# Patient Record
Sex: Male | Born: 1940 | Race: White | Hispanic: No | Marital: Married | Smoking: Light tobacco smoker
Health system: Southern US, Community
[De-identification: ages and names within clinical notes are randomized; demographics above are authoritative.]

## PROBLEM LIST (undated history)

## (undated) DIAGNOSIS — S060X9A Concussion with loss of consciousness of unspecified duration, initial encounter: Secondary | ICD-10-CM

## (undated) DIAGNOSIS — S060X1A Concussion with loss of consciousness of 30 minutes or less, initial encounter: Secondary | ICD-10-CM

## (undated) DIAGNOSIS — I10 Essential (primary) hypertension: Secondary | ICD-10-CM

## (undated) DIAGNOSIS — R3911 Hesitancy of micturition: Secondary | ICD-10-CM

## (undated) DIAGNOSIS — Z86010 Personal history of colonic polyps: Secondary | ICD-10-CM

## (undated) DIAGNOSIS — I639 Cerebral infarction, unspecified: Secondary | ICD-10-CM

## (undated) DIAGNOSIS — E119 Type 2 diabetes mellitus without complications: Secondary | ICD-10-CM

## (undated) HISTORY — PX: CATARACT EXTRACTION: SUR2

## (undated) HISTORY — DX: Cerebral infarction, unspecified: I63.9

## (undated) HISTORY — PX: COLONOSCOPY W/ POLYPECTOMY: SHX1380

---

## 2014-08-09 ENCOUNTER — Inpatient Hospital Stay (HOSPITAL_COMMUNITY)
Admission: EM | Admit: 2014-08-09 | Discharge: 2014-08-12 | DRG: 062 | Disposition: A | Discharge status: Rehabilitation Facility | Payer: Medicare HMO | Attending: Neurology | Admitting: Neurology

## 2014-08-09 ENCOUNTER — Emergency Department (HOSPITAL_COMMUNITY): Payer: Medicare HMO

## 2014-08-09 ENCOUNTER — Encounter (HOSPITAL_COMMUNITY): Payer: Self-pay | Admitting: Emergency Medicine

## 2014-08-09 DIAGNOSIS — L03113 Cellulitis of right upper limb: Secondary | ICD-10-CM | POA: Diagnosis not present

## 2014-08-09 DIAGNOSIS — E1165 Type 2 diabetes mellitus with hyperglycemia: Secondary | ICD-10-CM | POA: Diagnosis present

## 2014-08-09 DIAGNOSIS — E785 Hyperlipidemia, unspecified: Secondary | ICD-10-CM | POA: Diagnosis present

## 2014-08-09 DIAGNOSIS — I739 Peripheral vascular disease, unspecified: Secondary | ICD-10-CM | POA: Diagnosis present

## 2014-08-09 DIAGNOSIS — E1142 Type 2 diabetes mellitus with diabetic polyneuropathy: Secondary | ICD-10-CM | POA: Diagnosis present

## 2014-08-09 DIAGNOSIS — R1310 Dysphagia, unspecified: Secondary | ICD-10-CM | POA: Diagnosis present

## 2014-08-09 DIAGNOSIS — R29810 Facial weakness: Secondary | ICD-10-CM | POA: Diagnosis present

## 2014-08-09 DIAGNOSIS — I639 Cerebral infarction, unspecified: Secondary | ICD-10-CM | POA: Diagnosis present

## 2014-08-09 DIAGNOSIS — Z833 Family history of diabetes mellitus: Secondary | ICD-10-CM | POA: Diagnosis not present

## 2014-08-09 DIAGNOSIS — L0390 Cellulitis, unspecified: Secondary | ICD-10-CM | POA: Diagnosis present

## 2014-08-09 DIAGNOSIS — Z7982 Long term (current) use of aspirin: Secondary | ICD-10-CM | POA: Diagnosis not present

## 2014-08-09 DIAGNOSIS — M1000 Idiopathic gout, unspecified site: Secondary | ICD-10-CM | POA: Diagnosis not present

## 2014-08-09 DIAGNOSIS — W5501XA Bitten by cat, initial encounter: Secondary | ICD-10-CM | POA: Diagnosis present

## 2014-08-09 DIAGNOSIS — G8191 Hemiplegia, unspecified affecting right dominant side: Secondary | ICD-10-CM

## 2014-08-09 DIAGNOSIS — Z79899 Other long term (current) drug therapy: Secondary | ICD-10-CM | POA: Diagnosis not present

## 2014-08-09 DIAGNOSIS — R531 Weakness: Secondary | ICD-10-CM

## 2014-08-09 DIAGNOSIS — R471 Dysarthria and anarthria: Secondary | ICD-10-CM

## 2014-08-09 DIAGNOSIS — F17210 Nicotine dependence, cigarettes, uncomplicated: Secondary | ICD-10-CM | POA: Diagnosis present

## 2014-08-09 DIAGNOSIS — I10 Essential (primary) hypertension: Secondary | ICD-10-CM | POA: Diagnosis present

## 2014-08-09 DIAGNOSIS — Z9111 Patient's noncompliance with dietary regimen: Secondary | ICD-10-CM | POA: Diagnosis present

## 2014-08-09 DIAGNOSIS — I63312 Cerebral infarction due to thrombosis of left middle cerebral artery: Secondary | ICD-10-CM | POA: Diagnosis not present

## 2014-08-09 DIAGNOSIS — I679 Cerebrovascular disease, unspecified: Secondary | ICD-10-CM | POA: Diagnosis not present

## 2014-08-09 DIAGNOSIS — E1159 Type 2 diabetes mellitus with other circulatory complications: Secondary | ICD-10-CM | POA: Diagnosis not present

## 2014-08-09 DIAGNOSIS — Z794 Long term (current) use of insulin: Secondary | ICD-10-CM

## 2014-08-09 DIAGNOSIS — I6381 Other cerebral infarction due to occlusion or stenosis of small artery: Secondary | ICD-10-CM | POA: Diagnosis present

## 2014-08-09 DIAGNOSIS — G8190 Hemiplegia, unspecified affecting unspecified side: Secondary | ICD-10-CM | POA: Diagnosis not present

## 2014-08-09 DIAGNOSIS — R29898 Other symptoms and signs involving the musculoskeletal system: Secondary | ICD-10-CM

## 2014-08-09 HISTORY — DX: Concussion with loss of consciousness of unspecified duration, initial encounter: S06.0X9A

## 2014-08-09 HISTORY — DX: Concussion with loss of consciousness of 30 minutes or less, initial encounter: S06.0X1A

## 2014-08-09 HISTORY — DX: Essential (primary) hypertension: I10

## 2014-08-09 HISTORY — DX: Personal history of colonic polyps: Z86.010

## 2014-08-09 HISTORY — DX: Hesitancy of micturition: R39.11

## 2014-08-09 HISTORY — DX: Type 2 diabetes mellitus without complications: E11.9

## 2014-08-09 LAB — CBG MONITORING, ED: Glucose-Capillary: 196 mg/dL — ABNORMAL HIGH (ref 65–99)

## 2014-08-09 LAB — CBC
HCT: 40.3 % (ref 39.0–52.0)
HEMOGLOBIN: 13.7 g/dL (ref 13.0–17.0)
MCH: 28.4 pg (ref 26.0–34.0)
MCHC: 34 g/dL (ref 30.0–36.0)
MCV: 83.6 fL (ref 78.0–100.0)
Platelets: 248 10*3/uL (ref 150–400)
RBC: 4.82 MIL/uL (ref 4.22–5.81)
RDW: 12.8 % (ref 11.5–15.5)
WBC: 8.6 10*3/uL (ref 4.0–10.5)

## 2014-08-09 LAB — DIFFERENTIAL
BASOS ABS: 0 10*3/uL (ref 0.0–0.1)
Basophils Relative: 1 % (ref 0–1)
EOS ABS: 0.1 10*3/uL (ref 0.0–0.7)
EOS PCT: 1 % (ref 0–5)
Lymphocytes Relative: 24 % (ref 12–46)
Lymphs Abs: 2 10*3/uL (ref 0.7–4.0)
MONO ABS: 0.7 10*3/uL (ref 0.1–1.0)
MONOS PCT: 8 % (ref 3–12)
NEUTROS ABS: 5.8 10*3/uL (ref 1.7–7.7)
Neutrophils Relative %: 66 % (ref 43–77)

## 2014-08-09 LAB — COMPREHENSIVE METABOLIC PANEL
ALT: 21 U/L (ref 17–63)
ANION GAP: 9 (ref 5–15)
AST: 23 U/L (ref 15–41)
Albumin: 3.4 g/dL — ABNORMAL LOW (ref 3.5–5.0)
Alkaline Phosphatase: 90 U/L (ref 38–126)
BILIRUBIN TOTAL: 0.8 mg/dL (ref 0.3–1.2)
BUN: 22 mg/dL — ABNORMAL HIGH (ref 6–20)
CALCIUM: 8.6 mg/dL — AB (ref 8.9–10.3)
CO2: 22 mmol/L (ref 22–32)
Chloride: 105 mmol/L (ref 101–111)
Creatinine, Ser: 1.58 mg/dL — ABNORMAL HIGH (ref 0.61–1.24)
GFR calc Af Amer: 48 mL/min — ABNORMAL LOW (ref >60)
GFR calc non Af Amer: 41 mL/min — ABNORMAL LOW (ref >60)
Glucose, Bld: 197 mg/dL — ABNORMAL HIGH (ref 65–99)
POTASSIUM: 4 mmol/L (ref 3.5–5.1)
Sodium: 136 mmol/L (ref 135–145)
Total Protein: 6.3 g/dL — ABNORMAL LOW (ref 6.5–8.1)

## 2014-08-09 LAB — MRSA PCR SCREENING: MRSA by PCR: NEGATIVE

## 2014-08-09 LAB — I-STAT CHEM 8, ED
BUN: 24 mg/dL — ABNORMAL HIGH (ref 6–20)
CREATININE: 1.4 mg/dL — AB (ref 0.61–1.24)
Calcium, Ion: 1.16 mmol/L (ref 1.13–1.30)
Chloride: 103 mmol/L (ref 101–111)
GLUCOSE: 208 mg/dL — AB (ref 65–99)
HCT: 43 % (ref 39.0–52.0)
HEMOGLOBIN: 14.6 g/dL (ref 13.0–17.0)
Potassium: 3.9 mmol/L (ref 3.5–5.1)
Sodium: 140 mmol/L (ref 135–145)
TCO2: 21 mmol/L (ref 0–100)

## 2014-08-09 LAB — I-STAT TROPONIN, ED: TROPONIN I, POC: 0.02 ng/mL (ref 0.00–0.08)

## 2014-08-09 LAB — APTT: APTT: 26 s (ref 24–37)

## 2014-08-09 LAB — GLUCOSE, CAPILLARY
GLUCOSE-CAPILLARY: 160 mg/dL — AB (ref 65–99)
GLUCOSE-CAPILLARY: 232 mg/dL — AB (ref 65–99)

## 2014-08-09 LAB — PROTIME-INR
INR: 1.12 (ref 0.00–1.49)
PROTHROMBIN TIME: 14.6 s (ref 11.6–15.2)

## 2014-08-09 MED ORDER — SODIUM CHLORIDE 0.9 % IV SOLN
INTRAVENOUS | Status: DC
  Administered 2014-08-09: 13:00:00 via INTRAVENOUS

## 2014-08-09 MED ORDER — LABETALOL HCL 5 MG/ML IV SOLN
10.0000 mg | Freq: Once | INTRAVENOUS | Status: AC
  Administered 2014-08-09: 10 mg via INTRAVENOUS
  Filled 2014-08-09: qty 4

## 2014-08-09 MED ORDER — ACETAMINOPHEN 325 MG PO TABS: 650.0000 mg | ORAL_TABLET | ORAL | Status: DC | PRN

## 2014-08-09 MED ORDER — STROKE: EARLY STAGES OF RECOVERY BOOK
Freq: Once | Status: AC
  Administered 2014-08-09: 14:00:00
  Filled 2014-08-09: qty 1

## 2014-08-09 MED ORDER — INSULIN ASPART 100 UNIT/ML ~~LOC~~ SOLN
4.0000 [IU] | Freq: Once | SUBCUTANEOUS | Status: AC
  Administered 2014-08-09: 4 [IU] via SUBCUTANEOUS

## 2014-08-09 MED ORDER — SENNOSIDES-DOCUSATE SODIUM 8.6-50 MG PO TABS
1.0000 | ORAL_TABLET | Freq: Every evening | ORAL | Status: DC | PRN
  Filled 2014-08-09: qty 1

## 2014-08-09 MED ORDER — SODIUM CHLORIDE 0.9 % IV SOLN
1.5000 g | Freq: Four times a day (QID) | INTRAVENOUS | Status: DC
  Administered 2014-08-09 – 2014-08-10 (×4): 1.5 g via INTRAVENOUS
  Filled 2014-08-09 (×6): qty 1.5

## 2014-08-09 MED ORDER — LABETALOL HCL 5 MG/ML IV SOLN
10.0000 mg | INTRAVENOUS | Status: DC | PRN
  Administered 2014-08-09 – 2014-08-10 (×6): 10 mg via INTRAVENOUS
  Filled 2014-08-09 (×4): qty 4

## 2014-08-09 MED ORDER — ALTEPLASE (STROKE) FULL DOSE INFUSION
0.9000 mg/kg | INTRAVENOUS | Status: AC
  Administered 2014-08-09: 84 mg via INTRAVENOUS
  Filled 2014-08-09: qty 84

## 2014-08-09 MED ORDER — PANTOPRAZOLE SODIUM 40 MG IV SOLR
40.0000 mg | Freq: Every day | INTRAVENOUS | Status: DC
  Administered 2014-08-09: 40 mg via INTRAVENOUS
  Filled 2014-08-09 (×2): qty 40

## 2014-08-09 MED ORDER — INSULIN ASPART 100 UNIT/ML ~~LOC~~ SOLN
0.0000 [IU] | Freq: Three times a day (TID) | SUBCUTANEOUS | Status: DC
  Administered 2014-08-10: 5 [IU] via SUBCUTANEOUS
  Administered 2014-08-10: 8 [IU] via SUBCUTANEOUS
  Administered 2014-08-10 – 2014-08-11 (×2): 5 [IU] via SUBCUTANEOUS

## 2014-08-09 MED ORDER — ACETAMINOPHEN 650 MG RE SUPP: 650.0000 mg | RECTAL | Status: DC | PRN

## 2014-08-09 NOTE — ED Provider Notes (Signed)
CSN: [id number]     Arrival date & time 08/09/14  1024 History   First MD Initiated Contact with Patient 08/09/14 1025     Chief Complaint  Patient presents with  . Code Stroke     (Consider location/radiation/quality/duration/timing/severity/associated sxs/prior Treatment) HPI Comments: 74 year old male with a past medical history of diabetes and hypertension presents via EMS with sudden onset right-sided weakness, right arm drift, slurred speech and difficulty walking beginning around 8:30 AM today. Patient reports he woke up a little before 8 AM and was feeling fine, had breakfast, and was going over to his son's house when he got out of the car and his right leg gave out on him. States he had difficulty standing back up as he could not use his right arm, and then developed slurred speech. Denies history of the same. States he currently feels very weak on his right side. Denies headache, dizziness, lightheadedness, confusion, chest pain, shortness of breath. No history of stroke. Takes 81 mg aspirin nightly.  The history is provided by the patient and the EMS personnel.    Past Medical History  Diagnosis Date  . Hypertension   . Diabetes mellitus without complication    Past Surgical History  Procedure Laterality Date  . Eye surgery     Family History  Problem Relation Age of Onset  . Hypertension Mother   . Hypertension Father    History  Substance Use Topics  . Smoking status: Current Some Day Smoker    Types: Pipe  . Smokeless tobacco: Never Used  . Alcohol Use: Yes     Comment: "very rarely"    Review of Systems  Musculoskeletal: Positive for gait problem.  Neurological: Positive for speech difficulty and weakness.  All other systems reviewed and are negative.     Allergies  Review of patient's allergies indicates no known allergies.  Home Medications   Prior to Admission medications   Not on File   BP 188/96 mmHg  Pulse 69  Temp(Src) 98.4 F (36.9 C)   Ht  (1.651 m)  Wt 205 lb 7.5 oz (93.2 kg)  BMI 34.19 kg/m2  SpO2 100% Physical Exam  Constitutional: He is oriented to person, place, and time. He appears well-developed and well-nourished. No distress.  HENT:  Head: Normocephalic and atraumatic.  Eyes: Conjunctivae and EOM are normal. Pupils are equal, round, and reactive to light.  Neck: Normal range of motion. Neck supple.  Cardiovascular: Normal rate, regular rhythm and normal heart sounds.   Pulmonary/Chest: Effort normal and breath sounds normal.  Musculoskeletal: Normal range of motion. He exhibits no edema.  Neurological: He is alert and oriented to person, place, and time. He displays no atrophy. A cranial nerve deficit (CN VII- asymmetric smile on R) is present. No sensory deficit. He exhibits normal muscle tone. Coordination normal. GCS eye subscore is 4. GCS verbal subscore is 5. GCS motor subscore is 6.  Dysarthric speech. Normal finger-to-nose and heel-to-shin.  Skin: Skin is warm and dry.  Psychiatric: He has a normal mood and affect. His behavior is normal.  Nursing note and vitals reviewed.   ED Course  Procedures (including critical care time)  CRITICAL CARE Performed by: Celene Skeen   Total critical care time: 35 min  Critical care time was exclusive of separately billable procedures and treating other patients.  Critical care was necessary to treat or prevent imminent or life-threatening deterioration.  Critical care was time spent personally by me on the following activities: development  of treatment plan with patient and/or surrogate as well as nursing, discussions with consultants, evaluation of patient's response to treatment, examination of patient, obtaining history from patient or surrogate, ordering and performing treatments and interventions, ordering and review of laboratory studies, ordering and review of radiographic studies, pulse oximetry and re-evaluation of patient's condition.  Labs  Review Labs Reviewed  CBG MONITORING, ED - Abnormal; Notable for the following:    Glucose-Capillary 196 (*)    All other components within normal limits  I-STAT CHEM 8, ED - Abnormal; Notable for the following:    BUN 24 (*)    Creatinine, Ser 1.40 (*)    Glucose, Bld 208 (*)    All other components within normal limits  CBC  DIFFERENTIAL  PROTIME-INR  APTT  COMPREHENSIVE METABOLIC PANEL  I-STAT TROPOININ, ED    Imaging Review Ct Head Wo Contrast  08/09/2014   CLINICAL DATA:  Right-sided weakness  EXAM: CT HEAD WITHOUT CONTRAST  TECHNIQUE: Contiguous axial images were obtained from the base of the skull through the vertex without intravenous contrast.  COMPARISON:  None.  FINDINGS: Generalized atrophy.  Mild chronic microvascular white matter .  Negative for acute infarct.  Negative for hemorrhage or mass.  Ventricle size normal. No brain edema or shift of the midline structures.  Calvarium intact.  IMPRESSION: No acute intracranial abnormality  Critical Value/emergent results were called by telephone at the time of interpretation on 08/09/2014 at 10:32 am to [facility] , who verbally acknowledged these results.   Electronically Signed   By: Marlan Palau M.D.   On: 08/09/2014 10:32     EKG Interpretation None      MDM   Final diagnoses:  Stroke  Right leg weakness  Facial droop  Dysarthria   Symptoms onset 2 hours PTA. Neuro Dr. Hosie Poisson present on patient's arrival. Head CT negative. Right-sided deficit noted. NIHSS 3. Patient admitted by neurology for TPA.  D/w Dr. Gwendolyn Grant who also evaluated pt, agrees with plan.  Kathrynn Speed, PA-C 08/09/14 1621  Kathrynn Speed, PA-C 08/09/14 1622  Elwin Mocha, MD 08/09/14 [phone]

## 2014-08-09 NOTE — ED Notes (Signed)
Pt from home via [facility] with c/o right sided weakness, right arm drift, slurred speech, and difficulty walking.  Pt denies pain or dizziness.  Pt in NAD, A&O.

## 2014-08-09 NOTE — Code Documentation (Signed)
74yo male arriving to [facility] at 90 via [facility].  EMS reports patient had sudden onset right sided weakness at 0830.  Patient reports waking up at his baseline and driving to his son's house.  EMS was called and assessed right arm drift, difficulty walking, right facial droop and slurred speech and activated Code Stroke.  Stroke team at the bedside on arrival.  Labs drawn and patient cleared by Dr. Gwendolyn Grant.  Patient to CT.  NIHSS 3, see documentation for details and code stroke times.  Patient with right facial droop, right leg drift and dysarthria on exam.  2nd PIV placed by ED RN.  Dr. Hosie Poisson at the bedside to explain treatment with tPA to patient including risks and benefits.  Patient unable to make a decision on treatment initially, patient's wife to the bedside where Dr. Hosie Poisson again explained treatment with tPA, and patient and wife took additional time to make a decision to proceed with treatment with tPA.  Pharmacy called to mix tPA at 1043. SBP elevated above tPA parameters and treated with Labetalol  IVP.  tPA delivered to the bedside at 1051.  BP rechecked and now within tPA parameters.  Order to start tPA per Dr. Hosie Poisson.  tPA  bolus given over one minute at 1057 followed by /hr for a total of  per pharmacy dosing.  See documentation for post-tPA assessments and vital signs.  Patient to be admitted to 3M03.  Bedside handoff with ED RN Marylene Land.

## 2014-08-09 NOTE — Progress Notes (Signed)
Utilization review completed. Hiyab Nhem, RN, BSN. 

## 2014-08-09 NOTE — H&P (Addendum)
Referring Physician: Gwendolyn Grant    Chief Complaint: Code stroke  HPI:                                                                                                                                         George Stevens is an 74 y.o. male who woke up this AM feeling normal. He drove to his sons house and when he stood up he noted his right leg felt as if it was not working correctly.  He noted these symptoms worsened and he was not stable on his feet. EMS was called. On arrival to ED He had dysarthria, right facial droop and right leg weakness. CT head imaging reviewed and was negative. NIHSS 3.    Date last known well: Date: 08/09/2014 Time last known well: Time: 08:30 tPA Given: Yes, delay in giving tPA secondary to family requesting further time to decide whether or not they wished to proceed with tPA. Discussed risks including ICH and they expressed understanding.  Modified Rankin: Rankin Score=0    Past Medical History  Diagnosis Date  . Hypertension   . Diabetes mellitus without complication     Past Surgical History  Procedure Laterality Date  . Eye surgery      Family History  Problem Relation Age of Onset  . Hypertension Mother   . Hypertension Father    Social History:  reports that he has been smoking Pipe.  He has never used smokeless tobacco. He reports that he drinks alcohol. He reports that he does not use illicit drugs.  Allergies: No Known Allergies  Medications:                                                                                                                           No current facility-administered medications for this encounter.   No current outpatient prescriptions on file.     ROS:  History obtained from the patient  General ROS: negative for - chills, fatigue, fever, night sweats, weight gain or  weight loss Psychological ROS: negative for - behavioral disorder, hallucinations, memory difficulties, mood swings or suicidal ideation Ophthalmic ROS: negative for - blurry vision, double vision, eye pain or loss of vision ENT ROS: negative for - epistaxis, nasal discharge, oral lesions, sore throat, tinnitus or vertigo Allergy and Immunology ROS: negative for - hives or itchy/watery eyes Hematological and Lymphatic ROS: negative for - bleeding problems, bruising or swollen lymph nodes Endocrine ROS: negative for - galactorrhea, hair pattern changes, polydipsia/polyuria or temperature intolerance Respiratory ROS: negative for - cough, hemoptysis, shortness of breath or wheezing Cardiovascular ROS: negative for - chest pain, dyspnea on exertion, edema or irregular heartbeat Gastrointestinal ROS: negative for - abdominal pain, diarrhea, hematemesis, nausea/vomiting or stool incontinence Genito-Urinary ROS: negative for - dysuria, hematuria, incontinence or urinary frequency/urgency Musculoskeletal ROS: negative for - joint swelling or muscular weakness Neurological ROS: as noted in HPI Dermatological ROS: negative for rash and skin lesion changes  Neurologic Examination:                                                                                                      SpO2 98 %.  HEENT-  Normocephalic, no lesions, without obvious abnormality.  Normal external eye and conjunctiva.  Normal TM's bilaterally.  Normal auditory canals and external ears. Normal external nose, mucus membranes and septum.  Normal pharynx. Cardiovascular- S1, S2 normal, pulses palpable throughout   Lungs- chest clear, no wheezing, rales, normal symmetric air entry Abdomen- normal findings: bowel sounds normal Extremities- no edema Lymph-no adenopathy palpable Musculoskeletal-no joint tenderness, deformity or swelling Skin-small erythematous and edematous area on dorsal surface of right hand (location of cat  bit)  Neurological Examination Mental Status: Alert, oriented, thought content appropriate.  Speech dysarthric without evidence of aphasia.  Able to follow 3 step commands without difficulty. Cranial Nerves: II: Discs flat bilaterally; Visual fields grossly normal, pupils equal, round, reactive to light and accommodation III,IV, VI: ptosis not present, extra-ocular motions intact bilaterally V,VII: smile asymmetric on the right, facial light touch sensation normal bilaterally VIII: hearing normal bilaterally IX,X: uvula rises symmetrically XI: bilateral shoulder shrug XII: midline tongue extension Motor: Right : Upper extremity   5/5    Left:     Upper extremity   5/5  Lower extremity   4/5     Lower extremity   5/5 Tone and bulk:normal tone throughout; no atrophy noted Sensory: Pinprick and light touch intact throughout, bilaterally Deep Tendon Reflexes: 2+ and symmetric throughout Plantars: Right: downgoing   Left: downgoing Cerebellar: normal finger-to-nose  and normal heel-to-shin test Gait: not assessed due to patient safety.        Lab Results: Basic Metabolic Panel:  Recent Labs Lab 08/09/14 1023  NA 140  K 3.9  CL 103  GLUCOSE 208*  BUN 24*  CREATININE 1.40*    Liver Function Tests: No results for input(s): AST, ALT, ALKPHOS, BILITOT, PROT, ALBUMIN in the last 168 hours. No results for input(s): LIPASE,  AMYLASE in the last 168 hours. No results for input(s): AMMONIA in the last 168 hours.  CBC:  Recent Labs Lab 08/09/14 1023  HGB 14.6  HCT 43.0    Cardiac Enzymes: No results for input(s): CKTOTAL, CKMB, CKMBINDEX, TROPONINI in the last 168 hours.  Lipid Panel: No results for input(s): CHOL, TRIG, HDL, CHOLHDL, VLDL, LDLCALC in the last 168 hours.  CBG: No results for input(s): GLUCAP in the last 168 hours.  Microbiology: No results found for this or any previous visit.  Coagulation Studies: No results for input(s): LABPROT, INR in the last  72 hours.  Imaging: Ct Head Wo Contrast  08/09/2014   CLINICAL DATA:  Right-sided weakness  EXAM: CT HEAD WITHOUT CONTRAST  TECHNIQUE: Contiguous axial images were obtained from the base of the skull through the vertex without intravenous contrast.  COMPARISON:  None.  FINDINGS: Generalized atrophy.  Mild chronic microvascular white matter .  Negative for acute infarct.  Negative for hemorrhage or mass.  Ventricle size normal. No brain edema or shift of the midline structures.  Calvarium intact.  IMPRESSION: No acute intracranial abnormality  Critical Value/emergent results were called by telephone at the time of interpretation on 08/09/2014 at 10:32 am to [facility] , who verbally acknowledged these results.   Electronically Signed   By: Marlan Palau M.D.   On: 08/09/2014 10:32       Assessment and plan discussed with with attending physician and they are in agreement.    Felicie Morn PA-C [facility] [phone]  08/09/2014, 10:37 AM   Assessment: 74 y.o. male with new onset right facial droop, dysarthria, right leg weakness and unsteady gait.  Concern for left brain stroke. tPA was discussed with both patient and wife/ After extensive discussion of risk/benefits they decided to proceed with tPA.   Stroke Risk Factors - diabetes mellitus and hypertension  Plan: 1. HgbA1c, fasting lipid panel 2. MRI, MRA  of the brain without contrast 3. PT consult, OT consult, Speech consult 4. Echocardiogram 5. Carotid dopplers 6. Prophylactic therapy-None 7. Risk factor modification 8. Telemetry monitoring 9. Frequent neuro checks 10 NPO until passes stroke swallow screen 11. Admit to neuro-ICU.   Addendum: discussed cat bite with ID, Dr Drue Second. Will start unasyn and monitor for now, will potentially need to transition to PO upon discharge.   This patient is critically ill and at significant risk of neurological worsening, death and care requires constant monitoring of vital signs,  hemodynamics,respiratory and cardiac monitoring,review of multiple databases, neurological assessment, discussion with family, other specialists and medical decision making of high complexity. I spent 45 inutes of neurocritical care time in the care of this patient.   Elspeth Cho, DO [facility] [phone]  If 7pm- 7am, please page neurology on call as listed in AMION.

## 2014-08-10 ENCOUNTER — Inpatient Hospital Stay (HOSPITAL_COMMUNITY): Payer: Medicare HMO

## 2014-08-10 DIAGNOSIS — G8190 Hemiplegia, unspecified affecting unspecified side: Secondary | ICD-10-CM

## 2014-08-10 DIAGNOSIS — I10 Essential (primary) hypertension: Secondary | ICD-10-CM

## 2014-08-10 DIAGNOSIS — E1159 Type 2 diabetes mellitus with other circulatory complications: Secondary | ICD-10-CM

## 2014-08-10 DIAGNOSIS — I679 Cerebrovascular disease, unspecified: Secondary | ICD-10-CM

## 2014-08-10 DIAGNOSIS — I63312 Cerebral infarction due to thrombosis of left middle cerebral artery: Secondary | ICD-10-CM

## 2014-08-10 DIAGNOSIS — I639 Cerebral infarction, unspecified: Secondary | ICD-10-CM

## 2014-08-10 DIAGNOSIS — E785 Hyperlipidemia, unspecified: Secondary | ICD-10-CM

## 2014-08-10 LAB — RAPID URINE DRUG SCREEN, HOSP PERFORMED
Amphetamines: NOT DETECTED
Barbiturates: NOT DETECTED
Benzodiazepines: NOT DETECTED
Cocaine: NOT DETECTED
Opiates: NOT DETECTED
Tetrahydrocannabinol: NOT DETECTED

## 2014-08-10 LAB — GLUCOSE, CAPILLARY
GLUCOSE-CAPILLARY: 223 mg/dL — AB (ref 65–99)
Glucose-Capillary: 286 mg/dL — ABNORMAL HIGH (ref 65–99)
Glucose-Capillary: 221 mg/dL — ABNORMAL HIGH (ref 65–99)
Glucose-Capillary: 213 mg/dL — ABNORMAL HIGH (ref 65–99)

## 2014-08-10 LAB — LIPID PANEL
CHOL/HDL RATIO: 4.7 ratio
Cholesterol: 160 mg/dL (ref 0–200)
HDL: 34 mg/dL — ABNORMAL LOW (ref >40)
LDL Cholesterol: 91 mg/dL (ref 0–99)
Triglycerides: 177 mg/dL — ABNORMAL HIGH (ref <150)
VLDL: 35 mg/dL (ref 0–40)

## 2014-08-10 MED ORDER — CARVEDILOL 12.5 MG PO TABS
25.0000 mg | ORAL_TABLET | Freq: Two times a day (BID) | ORAL | Status: DC
  Administered 2014-08-11 – 2014-08-12 (×3): 25 mg via ORAL
  Filled 2014-08-10 (×3): qty 2

## 2014-08-10 MED ORDER — TERAZOSIN HCL 5 MG PO CAPS
5.0000 mg | ORAL_CAPSULE | Freq: Every day | ORAL | Status: DC
  Administered 2014-08-10 – 2014-08-11 (×2): 5 mg via ORAL
  Filled 2014-08-10 (×3): qty 1

## 2014-08-10 MED ORDER — ATORVASTATIN CALCIUM 40 MG PO TABS: 40.0000 mg | ORAL_TABLET | Freq: Every day | ORAL | Status: DC

## 2014-08-10 MED ORDER — INSULIN DETEMIR 100 UNIT/ML ~~LOC~~ SOLN
70.0000 [IU] | Freq: Every day | SUBCUTANEOUS | Status: DC
  Administered 2014-08-10 – 2014-08-11 (×2): 70 [IU] via SUBCUTANEOUS
  Filled 2014-08-10 (×3): qty 0.7

## 2014-08-10 MED ORDER — INSULIN ASPART 100 UNIT/ML ~~LOC~~ SOLN
25.0000 [IU] | Freq: Three times a day (TID) | SUBCUTANEOUS | Status: DC
  Administered 2014-08-11: 20 [IU] via SUBCUTANEOUS
  Administered 2014-08-11 (×2): 25 [IU] via SUBCUTANEOUS

## 2014-08-10 MED ORDER — CLOPIDOGREL BISULFATE 75 MG PO TABS
75.0000 mg | ORAL_TABLET | Freq: Every day | ORAL | Status: DC
  Administered 2014-08-10 – 2014-08-12 (×3): 75 mg via ORAL
  Filled 2014-08-10 (×3): qty 1

## 2014-08-10 MED ORDER — ATORVASTATIN CALCIUM 80 MG PO TABS
80.0000 mg | ORAL_TABLET | Freq: Every day | ORAL | Status: DC
  Administered 2014-08-10 – 2014-08-12 (×3): 80 mg via ORAL
  Filled 2014-08-10 (×3): qty 1

## 2014-08-10 MED ORDER — PANTOPRAZOLE SODIUM 40 MG PO TBEC
40.0000 mg | DELAYED_RELEASE_TABLET | Freq: Every day | ORAL | Status: DC
  Administered 2014-08-10 – 2014-08-12 (×3): 40 mg via ORAL
  Filled 2014-08-10 (×3): qty 1

## 2014-08-10 MED ORDER — AMOXICILLIN-POT CLAVULANATE 500-125 MG PO TABS
1.0000 | ORAL_TABLET | Freq: Two times a day (BID) | ORAL | Status: DC
  Administered 2014-08-10 – 2014-08-12 (×4): 500 mg via ORAL
  Filled 2014-08-10 (×8): qty 1

## 2014-08-10 MED ORDER — INSULIN LISPRO 100 UNIT/ML ~~LOC~~ SOLN: 25.0000 [IU] | Freq: Three times a day (TID) | SUBCUTANEOUS | Status: DC

## 2014-08-10 NOTE — Progress Notes (Signed)
STROKE TEAM PROGRESS NOTE   SUBJECTIVE (INTERVAL HISTORY) His wife is at the bedside.  Overall he feels his condition is unchanged. He still has right hand and foot mild weakness. MRI pending   OBJECTIVE Temp:  [98 F (36.7 C)-98.5 F (36.9 C)] 98.2 F (36.8 C) (08/03 1430) Pulse Rate:  [66-83] 83 (08/03 1400) Cardiac Rhythm:  [-] Normal sinus rhythm;Heart block (08/03 1630) Resp:  [15-32] 19 (08/03 1400) BP: (121-199)/(37-107) 180/72 mmHg (08/03 1430) SpO2:  [93 %-99 %] 99 % (08/03 1430)   Recent Labs Lab 08/09/14 1528 08/09/14 2140 08/10/14 0810 08/10/14 1258 08/10/14 1637  GLUCAP 160* 232* 286* 221* 223*    Recent Labs Lab 08/09/14 1013 08/09/14 1023  NA 136 140  K 4.0 3.9  CL 105 103  CO2 22  --   GLUCOSE 197* 208*  BUN 22* 24*  CREATININE 1.58* 1.40*  CALCIUM 8.6*  --     Recent Labs Lab 08/09/14 1013  AST 23  ALT 21  ALKPHOS 90  BILITOT 0.8  PROT 6.3*  ALBUMIN 3.4*    Recent Labs Lab 08/09/14 1013 08/09/14 1023  WBC 8.6  --   NEUTROABS 5.8  --   HGB 13.7 14.6  HCT 40.3 43.0  MCV 83.6  --   PLT 248  --    No results for input(s): CKTOTAL, CKMB, CKMBINDEX, TROPONINI in the last 168 hours.  Recent Labs  08/09/14 1013  LABPROT 14.6  INR 1.12   No results for input(s): COLORURINE, LABSPEC, PHURINE, GLUCOSEU, HGBUR, BILIRUBINUR, KETONESUR, PROTEINUR, UROBILINOGEN, NITRITE, LEUKOCYTESUR in the last 72 hours.  Invalid input(s): APPERANCEUR     Component Value Date/Time   CHOL 160 08/10/2014 0238   TRIG 177* 08/10/2014 0238   HDL 34* 08/10/2014 0238   CHOLHDL 4.7 08/10/2014 0238   VLDL 35 08/10/2014 0238   LDLCALC 91 08/10/2014 0238   No results found for: HGBA1C No results found for: LABOPIA, COCAINSCRNUR, LABBENZ, AMPHETMU, THCU, LABBARB  No results for input(s): ETH in the last 168 hours.  I have personally reviewed the radiological images below and agree with the radiology interpretations.  Ct Head Wo Contrast  08/09/2014     IMPRESSION: No acute intracranial abnormality     Mri and Mra Brain Wo Contrast  08/10/2014   IMPRESSION: 1. 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 2. MRA negative for treatable stenosis or occlusion. 3. 2 mm left carotid terminus outpouching which could reflect infundibulum or aneurysm. 4. Bifrontal gliosis, pattern consistent with remote cerebral contusions.   Carotid Doppler  Bilateral: 1-39% ICA stenosis. Vertebral artery flow is antegrade.  2D Echocardiogram  pending  PHYSICAL EXAM  Temp:  [98 F (36.7 C)-98.5 F (36.9 C)] 98.2 F (36.8 C) (08/03 1430) Pulse Rate:  [66-83] 83 (08/03 1400) Resp:  [15-32] 19 (08/03 1400) BP: (121-199)/(37-107) 180/72 mmHg (08/03 1430) SpO2:  [93 %-99 %] 99 % (08/03 1430)  General - Well nourished, well developed, in no apparent distress.  Ophthalmologic - Sharp disc margins OU.  Cardiovascular - Regular rate and rhythm with no murmur.  Neck - supple, no carotid bruits  Mental Status -  Level of arousal and orientation to time, place, and person were intact. Language including expression, naming, repetition, comprehension was assessed and found intact. Fund of Knowledge was assessed and was intact.  Cranial Nerves II - XII - II - Visual field intact OU. III, IV, VI - Extraocular movements intact. V -  Facial sensation intact bilaterally. VII - Facial movement intact bilaterally, but right nasolabial fold flattening. VIII - Hearing & vestibular intact bilaterally. X - Palate elevates symmetrically. XI - Chin turning & shoulder shrug intact bilaterally. XII - Tongue protrusion intact.  Motor Strength - The patient's strength was normal in all extremities except right hand dexterity difficulty and mildly weak grip and left foot drop and pronator drift was present on the right.  Bulk was normal and fasciculations were absent.   Motor Tone - Muscle tone was  assessed at the neck and appendages and was normal.  Reflexes - The patient's reflexes were symmetrical in all extremities and he had no pathological reflexes.  Sensory - Light touch, temperature/pinprick were assessed and were symmetrical.    Coordination - The patient had normal movements in the hands and feet with no ataxia or dysmetria.  Tremor was absent.  Gait and Station - deferred due to safety concerns.   ASSESSMENT/PLAN [name] is a 74 y.o. male with history of HTN, DM, HLD admitted for right sided weakness. Symptoms unchanged.    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  Patient counseled to be compliant with his antithrombotic medications  Ongoing aggressive stroke risk factor management  Therapy recommendations:  CIR  Disposition:  pending  Diabetes  HgbA1c pending goal < 7.0  Uncontrolled  Currently on levemir and novolog  CBG monitoring  SSI  DM education  Hypertension  Home meds:   Coreg, losartan, hctz Permissive hypertension - gradually normalized within 5-7 days. Currently on coreg  Unstable  Patient counseled to be compliant with his blood pressure medications  Hyperlipidemia  Home meds:  lipitor 40   Currently on lipitor 40  LDL 91, goal < 70  Increase to lipitor 80  Continue statin at discharge  Other Stroke Risk Factors  Advanced age  Sign of obstructive sleep apnea, need sleep study as OP  Other Active Problems  UDS pending  Other Pertinent History  Need sleep study as OP for OSA  Candidate for Stroke AF trial.    Hospital day # 1  Marvel Plan, MD PhD Stroke Neurology 08/10/2014 7:35 PM    To contact Stroke Continuity provider, please refer to [email or web address]. After hours, contact General Neurology

## 2014-08-10 NOTE — Evaluation (Signed)
Physical Therapy Evaluation Patient Details Name: George Stevens MRN: [id number] DOB: [birth year] Today's Date: 08/10/2014   History of Present Illness  74 y.o. male who woke up this AM feeling normal. He drove to his sons house and when he stood up he noted his right leg felt as if it was not working correctly. He noted these symptoms worsened and he was not stable on his feet. EMS was called. On arrival to ED He had dysarthria, right facial droop and right leg weakness.MRI postive for acute infarct.  Clinical Impression  Patient demonstrates deficits in functional mobility as indicated below. Will need continued skilled PT to address deficits and maximize function. Will see as indicated and progress as tolerated. OF NOTE: patient independent PTA, patient with good family support. Discussed recommendations at length with spouse and patient at bedside who appears emotional given level of deficits and desire to return to PLOF. VSS throughout session, BP 150s/90s. At this time, highly recommend CIR for comprehensive rehabilitation pose CVA.     Follow Up Recommendations CIR;Supervision/Assistance - 24 hour    Equipment Recommendations  Other (comment) (TBD)    Recommendations for Other Services Rehab consult     Precautions / Restrictions Precautions Precautions: Fall      Mobility  Bed Mobility Overal bed mobility: Needs Assistance Bed Mobility: Rolling;Sidelying to Sit Rolling: Min assist Sidelying to sit: Mod assist       General bed mobility comments: Assist to power up to sitting position. Patient able to initiate LE movement but required assist for RLE to position and roll. Assist to rotate trunk to EOb  Transfers Overall transfer level: Needs assistance Equipment used:  (Bilateral UE support via back of chair) Transfers: Sit to/from Stand Sit to Stand: Mod assist         General transfer comment: RLE buckling with elevation to standing, knee block required, poor  ability to power up secondary to RUE weakness.  Ambulation/Gait             General Gait Details: unable to perform, RLE could not withstand stance or initate stride  Stairs            Wheelchair Mobility    Modified Rankin (Stroke Patients Only)       Balance Overall balance assessment: Needs assistance Sitting-balance support: Feet supported Sitting balance-Leahy Scale: Poor Sitting balance - Comments: patient with posterior lean and difficulty maintaining midline Postural control: Posterior lean Standing balance support: Bilateral upper extremity supported;During functional activity Standing balance-Leahy Scale: Poor Standing balance comment: Moderate to maximal assist for standing, Moderate assist for mini squat with assist to power back to standing, unable to perform SLS on RLE                             Pertinent Vitals/Pain Pain Assessment: No/denies pain    Home Living Family/patient expects to be discharged to:: Private residence Living Arrangements: Spouse/significant other Available Help at Discharge: Family Type of Home: House Home Access: Stairs to enter Entrance Stairs-Rails: Can reach both Entrance Stairs-Number of Steps: 3 Home Layout: Multi-level;Bed/bath upstairs Home Equipment: None      Prior Function Level of Independence: Independent               Hand Dominance   Dominant Hand: Right    Extremity/Trunk Assessment   Upper Extremity Assessment: Defer to OT evaluation           Lower Extremity Assessment: RLE  deficits/detail RLE Deficits / Details: decreased strength RLE, 2+/5 fross motions, poor functional strength.       Communication      Cognition Arousal/Alertness: Awake/alert Behavior During Therapy: WFL for tasks assessed/performed Overall Cognitive Status: Within Functional Limits for tasks assessed                      General Comments      Exercises        Assessment/Plan     PT Assessment Patient needs continued PT services  PT Diagnosis Difficulty walking;Abnormality of gait;Generalized weakness;Hemiplegia dominant side   PT Problem List Decreased strength;Decreased range of motion;Decreased activity tolerance;Decreased balance;Decreased mobility;Decreased coordination;Decreased knowledge of use of DME;Decreased safety awareness  PT Treatment Interventions DME instruction;Gait training;Stair training;Functional mobility training;Therapeutic activities;Therapeutic exercise;Balance training;Patient/family education   PT Goals (Current goals can be found in the Care Plan section) Acute Rehab PT Goals Patient Stated Goal: to go home PT Goal Formulation: With patient/family Time For Goal Achievement: 08/24/14 Potential to Achieve Goals: Good    Frequency Min 4X/week   Barriers to discharge        Co-evaluation               End of Session Equipment Utilized During Treatment: Gait belt Activity Tolerance: Patient tolerated treatment well;No increased pain Patient left: in bed;with call bell/phone within reach;with bed alarm set;with family/visitor present Nurse Communication: Mobility status         Time: 1006-1040 PT Time Calculation (min) (ACUTE ONLY): 34 min   Charges:   PT Evaluation $Initial PT Evaluation Tier I: 1 Procedure PT Treatments $Therapeutic Activity: 8-22 mins   PT G CodesFabio Asa August 25, 2014, 1:13 PM Charlotte Crumb, PT DPT  [phone]

## 2014-08-10 NOTE — Progress Notes (Signed)
OT Cancellation Note  Patient Details Name: George Stevens MRN: [id number] DOB: July 05, 1940   Cancelled Treatment:    Reason Eval/Treat Not Completed: Pt transferring to floor.  Will reattempt.   Angelene Giovanni [place], OTR/L [phone]  08/10/2014, 2:17 PM

## 2014-08-10 NOTE — Evaluation (Signed)
Speech Language Pathology Evaluation Patient Details Name: George Stevens MRN: [id number] DOB: 12/18/1940 Today's Date: 08/10/2014 Time: 1531-1600 SLP Time Calculation (min) (ACUTE ONLY): 29 min  Problem List:  Patient Active Problem List   Diagnosis Date Noted  . CVA (cerebral infarction) 08/09/2014   Past Medical History:  Past Medical History  Diagnosis Date  . Hypertension   . Diabetes mellitus without complication    Past Surgical History:  Past Surgical History  Procedure Laterality Date  . Eye surgery     HPI:  74 y.o. male who woke up this AM feeling normal. He drove to his sons house and when he stood up he noted his right leg felt as if it was not working correctly. He noted these symptoms worsened and he was not stable on his feet. EMS was called. On arrival to ED He had dysarthria, right facial droop and right leg weakness.MRI postive for acute infarct.   Assessment / Plan / Recommendation Clinical Impression  Pt scored a 24/30 on the MoCA (<26 indicative of impairment), demonstrating difficulties with delayed recall, selective attention, and complex problem solving. Speech is mildly imprecise at the conversational level, with baseline lisp also noted. SLP will continue to follow - recommend CIR level therapy.    SLP Assessment  Patient needs continued Speech Lanaguage Pathology Services    Follow Up Recommendations  Inpatient Rehab    Frequency and Duration min 2x/week  2 weeks   Pertinent Vitals/Pain Pain Assessment: No/denies pain   SLP Goals  Patient/Family Stated Goal: none stated Potential to Achieve Goals (ACUTE ONLY): Good  SLP Evaluation Prior Functioning  Cognitive/Linguistic Baseline: Within functional limits Type of Home: House  Lives With: Spouse Available Help at Discharge: Family Vocation: Retired   IT consultant  Overall Cognitive Status: Impaired/Different from baseline Arousal/Alertness: Awake/alert Orientation Level: Oriented  X4 Attention: Selective Selective Attention: Impaired Selective Attention Impairment: Verbal basic Memory: Impaired Memory Impairment: Retrieval deficit;Decreased recall of new information Awareness: Appears intact Problem Solving: Impaired Problem Solving Impairment: Verbal complex Safety/Judgment: Appears intact    Comprehension  Auditory Comprehension Overall Auditory Comprehension: Appears within functional limits for tasks assessed Visual Recognition/Discrimination Discrimination: Within Function Limits Reading Comprehension Reading Status: Not tested    Expression Expression Primary Mode of Expression: Verbal Verbal Expression Overall Verbal Expression: Appears within functional limits for tasks assessed Written Expression Dominant Hand: Right Written Expression: Not tested   Oral / Motor Motor Speech Overall Motor Speech: Impaired Respiration: Within functional limits Phonation: Normal Resonance: Within functional limits Articulation: Impaired Level of Impairment: Conversation Intelligibility: Intelligible Motor Planning: Witnin functional limits Motor Speech Errors: Not applicable Interfering Components:  (premorbid lisp)     Maxcine Ham, M.A. CCC-SLP [phone]  Maxcine Ham 08/10/2014, 4:40 PM

## 2014-08-10 NOTE — Progress Notes (Signed)
*  PRELIMINARY RESULTS* Vascular Ultrasound Carotid Duplex (Doppler) has been completed.  Preliminary findings: Bilateral:  1-39% ICA stenosis.  Vertebral artery flow is antegrade.      Farrel Demark, RDMS, RVT  08/10/2014, 9:17 AM

## 2014-08-10 NOTE — Progress Notes (Signed)
PT Cancellation Note  Patient Details Name: George Stevens MRN: [id number] DOB: 1940/07/08   Cancelled Treatment:    Reason Eval/Treat Not Completed: Patient not medically ready (patient on active bedrest).    Fabio Asa 08/10/2014, 8:07 AM Charlotte Crumb, PT DPT  [phone]

## 2014-08-10 NOTE — Consult Note (Signed)
Physical Medicine and Rehabilitation Consult Reason for Consult: Acute nonhemorrhagic perforator infarct on the left Referring Physician: Dr.Xu    HPI: George Stevens is a 74 y.o.right handed  male with history of hypertension, diabetes mellitus with peripheral neuropathy. Lives with spouse independent prior to admission. Presented 08/09/2014 with right-sided weakness, facial droop and slurred speech. Cranial CT scan negative. 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 MRA negative for stenosis or occlusion. Carotid Dopplers with no ICA stenosis. Echocardiogram with ejection fraction of 65% no wall motion abnormalities with grade 1 diastolic dysfunction. Patient did receive TPA. Neurology consulted maintained on Plavix for CVA prophylaxis. Maintain on a regular consistency diet. Physical therapy evaluation completed 08/10/2014 with recommendations of physical medicine rehabilitation consult.   Review of Systems  Constitutional: Negative for fever and chills.  Eyes: Negative for blurred vision.  Respiratory: Negative for cough and shortness of breath.   Cardiovascular: Negative for chest pain, palpitations and leg swelling.  Gastrointestinal: Positive for nausea and constipation.  Genitourinary: Positive for urgency and frequency.  Musculoskeletal: Positive for myalgias and joint pain.  Skin: Negative for rash.  Neurological: Positive for dizziness, speech change and weakness. Negative for seizures and headaches.   Past Medical History  Diagnosis Date  . Hypertension   . Diabetes mellitus without complication    Past Surgical History  Procedure Laterality Date  . Eye surgery     Family History  Problem Relation Age of Onset  . Hypertension Mother   . Hypertension Father    Social History:  reports that he has been smoking Pipe.  He has never used smokeless tobacco. He  reports that he drinks alcohol. He reports that he does not use illicit drugs. Allergies: No Known Allergies Medications Prior to Admission  Medication Sig Dispense Refill  . aspirin 81 MG chewable tablet Chew 81 mg by mouth daily.    Marland Kitchen atorvastatin (LIPITOR) 40 MG tablet Take 40 mg by mouth daily.    . carvedilol (COREG) 25 MG tablet Take 25 mg by mouth 2 (two) times daily with a meal.    . insulin detemir (LEVEMIR) 100 UNIT/ML injection Inject 70 Units into the skin at bedtime.    . insulin lispro (HUMALOG) 100 UNIT/ML injection Inject 25 Units into the skin 3 (three) times daily before meals.    Marland Kitchen losartan-hydrochlorothiazide (HYZAAR) 100-25 MG per tablet Take 1 tablet by mouth daily.    . metFORMIN (GLUCOPHAGE) 1000 MG tablet Take 1,000 mg by mouth daily with breakfast.    . terazosin (HYTRIN) 5 MG capsule Take 5 mg by mouth at bedtime.      Home: Home Living Family/patient expects to be discharged to:: Private residence Living Arrangements: Spouse/significant other Available Help at Discharge: Family Type of Home: House Home Access: Stairs to enter Secretary/administrator of Steps: 3 Entrance Stairs-Rails: Can reach both Home Layout: Multi-level, Bed/bath upstairs Alternate Level Stairs-Number of Steps: 12 Alternate Level Stairs-Rails: Can reach both Bathroom Shower/Tub: Health visitor: Standard Home Equipment: None  Functional History: Prior Function Level of Independence: Independent Functional Status:  Mobility: Bed Mobility Overal bed mobility: Needs Assistance Bed Mobility: Rolling, Sidelying to Sit Rolling: Min assist Sidelying to sit: Mod assist General bed mobility comments: Assist to power up to sitting position. Patient able to initiate LE movement but required assist for RLE to position and roll. Assist to rotate trunk to EOb Transfers Overall transfer  level: Needs assistance Equipment used:  (Bilateral UE support via back of chair) Transfers:  Sit to/from Stand Sit to Stand: Mod assist General transfer comment: RLE buckling with elevation to standing, knee block required, poor ability to power up secondary to RUE weakness. Ambulation/Gait General Gait Details: unable to perform, RLE could not withstand stance or initate stride    ADL:    Cognition: Cognition Overall Cognitive Status: Within Functional Limits for tasks assessed Orientation Level: Oriented X4 Cognition Arousal/Alertness: Awake/alert Behavior During Therapy: WFL for tasks assessed/performed Overall Cognitive Status: Within Functional Limits for tasks assessed  Blood pressure 199/75, pulse 73, temperature 98.3 F (36.8 C), temperature source Oral, resp. rate 15, height 5\' 5"  (1.651 m), weight 93.2 kg (205 lb 7.5 oz), SpO2 96 %. Physical Exam  Constitutional: He is oriented to person, place, and time.  HENT:  Head: Normocephalic.  Eyes: EOM are normal.  Neck: Normal range of motion. Neck supple. No thyromegaly present.  Cardiovascular: Normal rate and regular rhythm.   Respiratory: Effort normal and breath sounds normal. No respiratory distress.  GI: Soft. Bowel sounds are normal. He exhibits no distension.  Neurological: He is alert and oriented to person, place, and time.  Following full commands. Good awareness of deficits. Speech is mildly dysarthric but fully intelligible. Minimal right 7. Cognitively appears appropriate. RUE 4- to 4/5 proximal to distal with pronator drift. RLE: 3+ HF, 4-KE and 4/5 ADF/APF. Decreased FMC on right. Senses pain and light touch although may be somewhat decreased in distal legs/feet. DTR's trace to 1+. No resting tone. No Babinski  Skin: Skin is warm and dry.  Psychiatric: He has a normal mood and affect. His behavior is normal. Thought content normal.    Results for orders placed or performed during the hospital encounter of 08/09/14 (from the past 24 hour(s))  Glucose, capillary     Status: Abnormal   Collection Time:  08/09/14  3:28 PM  Result Value Ref Range   Glucose-Capillary 160 (H) 65 - 99 mg/dL  Glucose, capillary     Status: Abnormal   Collection Time: 08/09/14  9:40 PM  Result Value Ref Range   Glucose-Capillary 232 (H) 65 - 99 mg/dL  Lipid panel     Status: Abnormal   Collection Time: 08/10/14  2:38 AM  Result Value Ref Range   Cholesterol 160 0 - 200 mg/dL   Triglycerides 161 (H) <150 mg/dL   HDL 34 (L) >09 mg/dL   Total CHOL/HDL Ratio 4.7 RATIO   VLDL 35 0 - 40 mg/dL   LDL Cholesterol 91 0 - 99 mg/dL  Glucose, capillary     Status: Abnormal   Collection Time: 08/10/14  8:10 AM  Result Value Ref Range   Glucose-Capillary 286 (H) 65 - 99 mg/dL   Comment 1 Notify RN    Comment 2 Document in Chart   Glucose, capillary     Status: Abnormal   Collection Time: 08/10/14 12:58 PM  Result Value Ref Range   Glucose-Capillary 221 (H) 65 - 99 mg/dL   Ct Head Wo Contrast  08/09/2014   CLINICAL DATA:  Right-sided weakness  EXAM: CT HEAD WITHOUT CONTRAST  TECHNIQUE: Contiguous axial images were obtained from the base of the skull through the vertex without intravenous contrast.  COMPARISON:  None.  FINDINGS: Generalized atrophy.  Mild chronic microvascular white matter .  Negative for acute infarct.  Negative for hemorrhage or mass.  Ventricle size normal. No brain edema or shift of the midline structures.  Calvarium intact.  IMPRESSION: No acute intracranial abnormality  Critical Value/emergent results were called by telephone at the time of interpretation on 08/09/2014 at 10:32 am to [facility] , who verbally acknowledged these results.   Electronically Signed   By: Marlan Palau M.D.   On: 08/09/2014 10:32   Mr Brain Wo Contrast  08/10/2014   CLINICAL DATA:  Stroke with dysarthria, right facial droop common right leg weakness. TPA given.  EXAM: MRI HEAD WITHOUT CONTRAST  MRA HEAD WITHOUT CONTRAST  TECHNIQUE: Multiplanar, multiecho pulse sequences of the brain and surrounding structures were obtained without  intravenous contrast. Angiographic images of the head were obtained using MRA technique without contrast.  COMPARISON:  None.  FINDINGS: MRI HEAD FINDINGS  Calvarium and upper cervical spine: No focal marrow signal abnormality.  Orbits: Bilateral cataract resection.  Sinuses and Mastoids: Clear. Mastoid and middle ears are clear.  Brain: Restricted diffusion in the posterior left putamen, internal capsule, up to the posterior body of the caudate nucleus. Favor anterior choroidal distribution upper lateral lenticulostriate given the posterior appearance, even though there is sparing along the mesial temporal lobe and involvement of the posterior putamen. No superimposed hemorrhage. No additional infarct.  Mild chronic small vessel disease with ischemic gliosis patchy throughout the bilateral cerebral white matter. There is dense gliosis and encephalomalacia in the inferior and polar right more than left frontal lobes, cortical and subcortical.  No evidence of major vessel occlusion, hydrocephalus, mass lesion.  MRA HEAD FINDINGS  Anterior and left posterior communicating arteries are present. Strong dominance of the left vertebral artery.  No definite major branch occlusion, stenosis, or vascular malformation. A 2 mm outpouching from the left carotid terminus, directed inferiorly and anteriorly could reflect a infundibulum (possibly of an occluded anterior choroidal artery) or aneurysm. The outpouching is beyond the left posterior communicating artery origin.  IMPRESSION: 1. 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 2. MRA negative for treatable stenosis or occlusion. 3. 2 mm left carotid terminus outpouching which could reflect infundibulum or aneurysm. 4. Bifrontal gliosis, pattern consistent with remote cerebral contusions.   Electronically Signed   By: Marnee Spring M.D.   On: 08/10/2014 12:34   Mr Maxine Glenn Head/brain Wo Cm  08/10/2014    CLINICAL DATA:  Stroke with dysarthria, right facial droop common right leg weakness. TPA given.  EXAM: MRI HEAD WITHOUT CONTRAST  MRA HEAD WITHOUT CONTRAST  TECHNIQUE: Multiplanar, multiecho pulse sequences of the brain and surrounding structures were obtained without intravenous contrast. Angiographic images of the head were obtained using MRA technique without contrast.  COMPARISON:  None.  FINDINGS: MRI HEAD FINDINGS  Calvarium and upper cervical spine: No focal marrow signal abnormality.  Orbits: Bilateral cataract resection.  Sinuses and Mastoids: Clear. Mastoid and middle ears are clear.  Brain: Restricted diffusion in the posterior left putamen, internal capsule, up to the posterior body of the caudate nucleus. Favor anterior choroidal distribution upper lateral lenticulostriate given the posterior appearance, even though there is sparing along the mesial temporal lobe and involvement of the posterior putamen. No superimposed hemorrhage. No additional infarct.  Mild chronic small vessel disease with ischemic gliosis patchy throughout the bilateral cerebral white matter. There is dense gliosis and encephalomalacia in the inferior and polar right more than left frontal lobes, cortical and subcortical.  No evidence of major vessel occlusion, hydrocephalus, mass lesion.  MRA HEAD FINDINGS  Anterior and left posterior communicating arteries are present. Strong dominance of the  left vertebral artery.  No definite major branch occlusion, stenosis, or vascular malformation. A 2 mm outpouching from the left carotid terminus, directed inferiorly and anteriorly could reflect a infundibulum (possibly of an occluded anterior choroidal artery) or aneurysm. The outpouching is beyond the left posterior communicating artery origin.  IMPRESSION: 1. 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 2. MRA negative for treatable stenosis or occlusion. 3.  2 mm left carotid terminus outpouching which could reflect infundibulum or aneurysm. 4. Bifrontal gliosis, pattern consistent with remote cerebral contusions.   Electronically Signed   By: Marnee Spring M.D.   On: 08/10/2014 12:34    Assessment/Plan: Diagnosis: left basal ganglia/corona radiata infarct 1. Does the need for close, 24 hr/day medical supervision in concert with the patient's rehab needs make it unreasonable for this patient to be served in a less intensive setting? Yes 2. Co-Morbidities requiring supervision/potential complications: dm, htn, dpn 3. Due to bladder management, bowel management, safety, skin/wound care, disease management, medication administration, pain management and patient education, does the patient require 24 hr/day rehab nursing? Yes 4. Does the patient require coordinated care of a physician, rehab nurse, PT (1-2 hrs/day, 5 days/week) and OT (1-2 hrs/day, 5 days/week), SLP? to address physical and functional deficits in the context of the above medical diagnosis(es)? Yes Addressing deficits in the following areas: balance, endurance, locomotion, strength, transferring, bowel/bladder control, bathing, dressing, feeding, grooming, toileting and psychosocial support 5. Can the patient actively participate in an intensive therapy program of at least 3 hrs of therapy per day at least 5 days per week? Yes 6. The potential for patient to make measurable gains while on inpatient rehab is excellent 7. Anticipated functional outcomes upon discharge from inpatient rehab are modified independent  with PT, modified independent with OT, n/a with SLP. 8. Estimated rehab length of stay to reach the above functional goals is: 8-12 days 9. Does the patient have adequate social supports and living environment to accommodate these discharge functional goals? Yes 10. Anticipated D/C setting: Home 11. Anticipated post D/C treatments: HH therapy and Outpatient therapy 12. Overall  Rehab/Functional Prognosis: excellent  RECOMMENDATIONS: This patient's condition is appropriate for continued rehabilitative care in the following setting: CIR Patient has agreed to participate in recommended program. Yes Note that insurance prior authorization may be required for reimbursement for recommended care.  Comment: Rehab Admissions Coordinator to follow up.  Thanks,  Ranelle Oyster, MD, [place] [facility]     08/10/2014

## 2014-08-10 NOTE — Progress Notes (Signed)
  Echocardiogram 2D Echocardiogram has been performed.  George Stevens 08/10/2014, 3:36 PM

## 2014-08-10 NOTE — Progress Notes (Signed)
SLP Cancellation Note  Patient Details Name: George Stevens MRN: [id number] DOB: [birth year]   Cancelled treatment:       Reason Eval/Treat Not Completed: Patient at procedure or test/unavailable. Will return as able.   Maxcine Ham, M.A. CCC-SLP [phone]  Maxcine Ham 08/10/2014, 11:44 AM

## 2014-08-10 NOTE — Progress Notes (Signed)
Patient was screened by Daryll Spisak for appropriateness for an Inpatient Acute Rehab consult.  At this time, we are recommending Inpatient Rehab consult.  Please order when you feel appropriate.   Vilda Zollner PT Inpatient Rehab Admissions Coordinator Cell [phone] Office [phone]  

## 2014-08-10 NOTE — Progress Notes (Addendum)
Inpatient Diabetes Program Recommendations  AACE/ADA: New Consensus Statement on Inpatient Glycemic Control (2013)  Target Ranges:  Prepandial:   less than 140 mg/dL      Peak postprandial:   less than 180 mg/dL (1-2 hours)      Critically ill patients:  140 - 180 mg/dL   Results for George Stevens, George Stevens (MRN [id number]) as of 08/10/2014 09:05  Ref. Range 08/09/2014 15:28 08/09/2014 21:40 08/10/2014 08:10  Glucose-Capillary Latest Ref Range: 65-99 mg/dL 409 (H) 811 (H) 914 (H)   Reason for Admission: Code stroke  Diabetes history: DM 2 Outpatient Diabetes medications: Levemir 70 units QHS, Humalog 25 units TID with meals , Metformin 1,000 mg Daily Current orders for Inpatient glycemic control: Novolog Moderate scale.  Inpatient Diabetes Program Recommendations Insulin - Basal: Patient takes levemir 70 units QHS at home. Please consider starting basal insulin, Levemir 20 units while inpatient. Correction (SSI): Please add Novolog HS scale  Thanks,  Christena Deem RN, MSN, [facility] Inpatient Diabetes Coordinator Team Pager [phone]

## 2014-08-11 DIAGNOSIS — E669 Obesity, unspecified: Secondary | ICD-10-CM

## 2014-08-11 DIAGNOSIS — G4733 Obstructive sleep apnea (adult) (pediatric): Secondary | ICD-10-CM

## 2014-08-11 LAB — HEMOGLOBIN A1C
Hgb A1c MFr Bld: 10.7 % — ABNORMAL HIGH (ref 4.8–5.6)
MEAN PLASMA GLUCOSE: 260 mg/dL

## 2014-08-11 LAB — GLUCOSE, CAPILLARY
GLUCOSE-CAPILLARY: 208 mg/dL — AB (ref 65–99)
GLUCOSE-CAPILLARY: 65 mg/dL (ref 65–99)
Glucose-Capillary: 138 mg/dL — ABNORMAL HIGH (ref 65–99)
Glucose-Capillary: 121 mg/dL — ABNORMAL HIGH (ref 65–99)
Glucose-Capillary: 148 mg/dL — ABNORMAL HIGH (ref 65–99)

## 2014-08-11 MED ORDER — LOSARTAN POTASSIUM 50 MG PO TABS
100.0000 mg | ORAL_TABLET | Freq: Every day | ORAL | Status: DC
  Administered 2014-08-12: 100 mg via ORAL
  Filled 2014-08-11: qty 2

## 2014-08-11 MED ORDER — INSULIN ASPART 100 UNIT/ML ~~LOC~~ SOLN
0.0000 [IU] | Freq: Three times a day (TID) | SUBCUTANEOUS | Status: DC
  Administered 2014-08-11: 2 [IU] via SUBCUTANEOUS
  Administered 2014-08-12: 3 [IU] via SUBCUTANEOUS

## 2014-08-11 NOTE — Progress Notes (Signed)
Occupational Therapy Evaluation Patient Details Name: George Stevens MRN: [id number] DOB: [birth year] Today's Date: 08/11/2014    History of Present Illness 74 y.o. male who woke up this AM feeling normal. He drove to his sons house and when he stood up he noted his right leg felt as if it was not working correctly. He noted these symptoms worsened and he was not stable on his feet. EMS was called. On arrival to ED He had dysarthria, right facial droop and right leg weakness.MRI postive for acute infarct L lateral lenticulostriate distribution. NIHSS 3.   Clinical Impression   PTA, pt independent with ADL and mobility. Pt presents with significant deficits listed below and is an excellent CIR candidate. Pt very motivated to return to PLOF. Will follow acutely to facilitate D/C to CIR.     Follow Up Recommendations  CIR;Supervision/Assistance - 24 hour    Equipment Recommendations  Tub/shower seat    Recommendations for Other Services Rehab consult     Precautions / Restrictions Precautions Precautions: Fall      Mobility Bed Mobility               General bed mobility comments: Pt up in recliner  Transfers Overall transfer level: Needs assistance Equipment used:  (Bilateral UE support via back of chair)   Sit to Stand: Mod assist         General transfer comment: RLE buckling with elevation to standing, knee block required, poor ability to power up secondary to RUE weakness.    Balance     Sitting balance-Leahy Scale: Poor Sitting balance - Comments: patient with posterior lean and difficulty maintaining midline/ R bias Postural control: Posterior lean;Right lateral lean Standing balance support: Bilateral upper extremity supported;During functional activity Standing balance-Leahy Scale: Poor Standing balance comment: R lateral lean. difficulty monitoring controlled movement patterns. Able to achieve midline, but unable to maintain midline postural  control                            ADL Overall ADL's : Needs assistance/impaired Eating/Feeding: Set up   Grooming: Minimal assistance   Upper Body Bathing: Minimal assitance;Sitting   Lower Body Bathing: Moderate assistance;Sit to/from stand   Upper Body Dressing : Moderate assistance;Sitting   Lower Body Dressing: Moderate assistance;Sit to/from stand   Toilet Transfer: Moderate assistance   Toileting- Clothing Manipulation and Hygiene: Moderate assistance       Functional mobility during ADLs: Moderate assistance General ADL Comments: Pt very motivated to participate with ADL. Note poor midline orientation and trunk control during ADL. Motor impersistance during ADL. Initiates use of RUE.   Pt brushing his teeth on entry to room. Attempting to use RUE.     Vision Additional Comments: Appears intatct. Will further assess   Perception     Praxis Praxis Praxis tested?: Deficits Deficits: Motor Impersistence    Pertinent Vitals/Pain Pain Assessment: No/denies pain     Hand Dominance Right   Extremity/Trunk Assessment Upper Extremity Assessment Upper Extremity Assessment: RUE deficits/detail RUE Deficits / Details: RUE AROM WFL. generalized weakness. Tone appears normal. Pt with isolated movement, but clumsy patterns. Pt substituting to compensate for weakn RUE. Trying to use RUE in functional tasks. Pt states 'mu arm just won't do what I want it to do"Moto impersistence RUE Sensation:  (appears intact) RUE Coordination: decreased fine motor;decreased gross motor (able to hold washcloth, toothbrush, but difficulty with in h)   Lower Extremity Assessment Lower Extremity  Assessment: Defer to PT evaluation (r knee buckling. ) RLE Deficits / Details: decreased strength RLE, 2+/5 fross motions, poor functional strength. RLE Sensation: decreased proprioception RLE Coordination: decreased fine motor;decreased gross motor   Cervical / Trunk  Assessment Cervical / Trunk Assessment: Other exceptions Cervical / Trunk Exceptions: R bias. poor midline orientation   Communication Communication Communication: Other (comment) (mild dysarthria)   Cognition Arousal/Alertness: Awake/alert Behavior During Therapy: WFL for tasks assessed/performed - appears flat  Overall Cognitive Status: Will further assess Pt asking "why did this happen to me"?                     General Comments   Pt/wife asking questions about home modifications needed. Educated family that these recommendations would be made on CIR and closer to D/C home.     Exercises       Shoulder Instructions      Home Living Family/patient expects to be discharged to:: Private residence Living Arrangements: Spouse/significant other Available Help at Discharge: Family Type of Home: House Home Access: Stairs to enter Secretary/administrator of Steps: 3 Entrance Stairs-Rails: Can reach both Home Layout: Multi-level;Bed/bath upstairs Alternate Level Stairs-Number of Steps: 12 Alternate Level Stairs-Rails: Can reach both Bathroom Shower/Tub: Producer, television/film/video: Standard Bathroom Accessibility: Yes How Accessible: Accessible via walker Home Equipment: None      Lives With: Spouse    Prior Functioning/Environment Level of Independence: Independent             OT Diagnosis: Generalized weakness;Hemiplegia dominant side   OT Problem List: Decreased strength;Impaired balance (sitting and/or standing);Decreased coordination;Decreased safety awareness;Decreased knowledge of use of DME or AE;Obesity;Impaired UE functional use;Pain   OT Treatment/Interventions: Self-care/ADL training;Therapeutic exercise;Neuromuscular education;DME and/or AE instruction;Therapeutic activities;Patient/family education;Balance training    OT Goals(Current goals can be found in the care plan section) Acute Rehab OT Goals Patient Stated Goal: to get better OT  Goal Formulation: With patient Time For Goal Achievement: 08/25/14 Potential to Achieve Goals: Good ADL Goals Pt Will Perform Grooming: with set-up;sitting Pt Will Perform Upper Body Bathing: with set-up;sitting Pt Will Perform Lower Body Bathing: with set-up;sit to/from stand;with supervision Pt Will Perform Upper Body Dressing: with set-up;sitting Pt Will Transfer to Toilet: with min guard assist;ambulating Additional ADL Goal #1: Maintain midline postural control during ADL tasks 75 % of session without vc  OT Frequency: Min 3X/week   Barriers to D/C:            Co-evaluation              End of Session Equipment Utilized During Treatment: Gait belt Nurse Communication: Mobility status  Activity Tolerance: Patient tolerated treatment well Patient left: in chair;with call bell/phone within reach;with chair alarm set;with family/visitor present   Time: 4098-1191 OT Time Calculation (min): 26 min Charges:  OT General Charges $OT Visit: 1 Procedure OT Evaluation $Initial OT Evaluation Tier I: 1 Procedure OT Treatments $Self Care/Home Management : 8-22 mins G-Codes:    Adrianah Prophete,HILLARY 2014-08-31, 1:33 PM   [facility], OTR/L  [phone] Aug 31, 2014

## 2014-08-11 NOTE — PMR Pre-admission (Signed)
PMR Admission Coordinator Pre-Admission Assessment  Patient: George Stevens is an 74 y.o., male MRN: [id number] DOB: 1940/07/02 Height: 5\' 5"  (165.1 cm) Weight: 93.2 kg (205 lb 7.5 oz)              Insurance Information HMO:     PPO: yes     PCP:      IPA:      80/20:      OTHER: medicare advantage plan PRIMARY: [employer] medicare      Policy#Marland Mcalpine, Group # [id number]      Subscriber: pt CM Name: Karel Jarvis      Phone#: [phone] ext [phone]     Fax#: [phone] Authorization given for 10 days from [place] with updates due to [place] at above fax on 2-44-01 Pre-Cert#: [id number]     Employer: retired Benefits:  Phone #: online     Name: 08/11/14 Eff. Date: 01/07/14     Deduct: none      Out of Pocket Max: $4950      Life Max: none CIR: $285 per day days 1-6/ $1710 max      SNF: no copay days 1-20; $160 copay per day days 21-100 Outpatient: $40 copay per visit     Co-Pay: no visit limit Home Health: 100%      Co-Pay: no visit limit DME: 80%     Co-Pay: 20% Providers: in network  SECONDARY: none        Medicaid Application Date:       Case Manager:  Disability Application Date:       Case Worker:   Emergency Conservator, museum/gallery Information    Name Relation Home Work Mobile   Cashman,Frances Spouse [phone]  [phone]   Kimbler,[name] Daughter   [phone]     Current Medical History  Patient Admitting Diagnosis: left basal ganglia/corona radiata infarct  History of Present Illness: George Stevens is a 74 y.o.RH male with history of HTN, DM type 2, who was admitted on 08/09/2014 with right-sided weakness, facial droop and slurred speech. 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 MRA was negative for stenosis or occlusion. Carotid Dopplers without ICA stenosis. 2D echo with ejection fraction of 65% no wall motion abnormalities with grade 1 diastolic  dysfunction. Patient did receive TPA and was started on Unasyn due to cellulitis from recent cat bite. . Dr. 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 Sleep study recommended on outpatient basis due to concerns of sleep apnea.   Total: 2 NIH    Past Medical History  Past Medical History  Diagnosis Date  . Hypertension   . Diabetes mellitus without complication     Family History  family history includes Hypertension in his father and mother.  Prior Rehab/Hospitalizations:  Has the patient had major surgery during 100 days prior to admission? No  Current Medications   Current facility-administered medications:  .  acetaminophen (TYLENOL) tablet 650 mg, 650 mg, Oral, Q4H PRN **OR** [DISCONTINUED] acetaminophen (TYLENOL) suppository 650 mg, 650 mg, Rectal, Q4H PRN, Ulice Dash, PA-C .  amoxicillin-clavulanate (AUGMENTIN) 500-125 MG per tablet 500 mg, 1 tablet, Oral, Q12H, Marvel Plan, MD, 500 mg at 08/12/14 0630 .  atorvastatin (LIPITOR) tablet 80 mg, 80 mg, Oral, Daily, Marvel Plan, MD, 80 mg at 08/12/14 0949 .  carvedilol (COREG) tablet 25 mg, 25 mg, Oral, BID WC, Marvel Plan,  MD, 25 mg at 08/12/14 0827 .  clopidogrel (PLAVIX) tablet 75 mg, 75 mg, Oral, Daily, Marvel Plan, MD, 75 mg at 08/12/14 0949 .  insulin aspart (novoLOG) injection 0-15 Units, 0-15 Units, Subcutaneous, TID WC, Marvel Plan, MD, 2 Units at 08/11/14 1235 .  insulin aspart (novoLOG) injection 25 Units, 25 Units, Subcutaneous, TID WC, Marvel Plan, MD, 20 Units at 08/11/14 1733 .  insulin detemir (LEVEMIR) injection 70 Units, 70 Units, Subcutaneous, QHS, Marvel Plan, MD, 70 Units at 08/11/14 2356 .  labetalol (NORMODYNE,TRANDATE) injection 10 mg, 10 mg, Intravenous, Q10 min PRN, Ulice Dash, PA-C, 10 mg at 08/10/14 0855 .  losartan (COZAAR) tablet 100 mg, 100 mg, Oral, Daily, Marvel Plan, MD, 100 mg at 08/12/14 0949 .  pantoprazole (PROTONIX) EC  tablet 40 mg, 40 mg, Oral, Daily, Marvel Plan, MD, 40 mg at 08/12/14 0949 .  senna-docusate (Senokot-S) tablet 1 tablet, 1 tablet, Oral, QHS PRN, Ulice Dash, PA-C .  terazosin (HYTRIN) capsule 5 mg, 5 mg, Oral, QHS, Marvel Plan, MD, 5 mg at 08/11/14 2356  Patients Current Diet: Diet heart healthy/carb modified Room service appropriate?: Yes; Fluid consistency:: Thin  Precautions / Restrictions Precautions Precautions: Fall Restrictions Weight Bearing Restrictions: No   Has the patient had 2 or more falls or a fall with injury in the past year?No  Prior Activity Level Community (5-7x/wk): retired Counselling psychologist for 14 years. Independent, travels, socializes with family  Journalist, newspaper / Equipment Home Assistive Devices/Equipment: None Home Equipment: None  Prior Device Use: Indicate devices/aids used by the patient prior to current illness, exacerbation or injury? None of the above  Prior Functional Level Prior Function Level of Independence: Independent  Self Care: Did the patient need help bathing, dressing, using the toilet or eating?  Independent  Indoor Mobility: Did the patient need assistance with walking from room to room (with or without device)? Independent  Stairs: Did the patient need assistance with internal or external stairs (with or without device)? Independent  Functional Cognition: Did the patient need help planning regular tasks such as shopping or remembering to take medications? Independent  Current Functional Level Cognition  Arousal/Alertness: Awake/alert Overall Cognitive Status: Impaired/Different from baseline Orientation Level: Oriented X4 Attention: Selective Selective Attention: Impaired Selective Attention Impairment: Verbal basic Memory: Impaired Memory Impairment: Retrieval deficit, Decreased recall of new information Awareness: Appears intact Problem Solving: Impaired Problem Solving Impairment: Verbal  complex Safety/Judgment: Appears intact    Extremity Assessment (includes Sensation/Coordination)  Upper Extremity Assessment: RUE deficits/detail RUE Deficits / Details: RUE AROM WFL. generalized weakness. Tone appears normal. Pt with isolated movement, but clumsy patterns. Pt substituting to compensate for weakn RUE. Trying to use RUE in functional tasks. Pt states 'mu arm just won't do what I want it to do"Moto impersistence RUE Sensation:  (appears intact) RUE Coordination: decreased fine motor, decreased gross motor (able to hold washcloth, toothbrush, but difficulty with in h)  Lower Extremity Assessment: Defer to PT evaluation (r knee buckling. ) RLE Deficits / Details: decreased strength RLE, 2+/5 fross motions, poor functional strength. RLE Sensation: decreased proprioception RLE Coordination: decreased fine motor, decreased gross motor    ADLs  Overall ADL's : Needs assistance/impaired Eating/Feeding: Set up Grooming: Minimal assistance Upper Body Bathing: Minimal assitance, Sitting Lower Body Bathing: Moderate assistance, Sit to/from stand Upper Body Dressing : Moderate assistance, Sitting Lower Body Dressing: Moderate assistance, Sit to/from stand Toilet Transfer: Moderate assistance Toileting- Clothing Manipulation and Hygiene: Moderate assistance Functional mobility during ADLs: Moderate assistance  General ADL Comments: Pt very motivated to participate with ADL. Note poor midline orientation and trunk control during ADL. Motor impersistance during ADL. Initiates use of RUE.     Mobility  Overal bed mobility: Needs Assistance Bed Mobility: Rolling, Sidelying to Sit Rolling: Min assist Sidelying to sit: Min assist General bed mobility comments: patient able to roll to EOB with min assist and cues, improved ability to power up to sitting, min assist for stability at EOB.    Transfers  Overall transfer level: Needs assistance Equipment used: Rolling walker (2  wheeled) Transfers: Sit to/from Stand, [employer] Transfers Sit to Stand: Mod assist, +2 physical assistance Stand pivot transfers: Mod assist General transfer comment: Improved control of RLE for power up but easily fatigued and required cues for setting of RLE upon pivotal transfer. VCs for hand placement with assist for R grip    Ambulation / Gait / Stairs / Wheelchair Mobility  Ambulation/Gait Ambulation/Gait assistance: Mod assist, +2 physical assistance Ambulation Distance (Feet): 16 Feet Assistive device: Rolling walker (2 wheeled) Gait Pattern/deviations: Step-through pattern, Decreased stride length, Ataxic, Decreased dorsiflexion - right, Decreased stance time - right, Decreased step length - right, Decreased weight shift to left, Narrow base of support General Gait Details: patient with poor coordination of giat, decreased ability to stabilize stride length. Assist required to faciliate weight shift to the left for RLE advancement. Assist for upright posture and to maintain stability with RW. Poor overall functional coordiation, continuous cues for sequencing throughout ambulation, 8 steps x2 Gait velocity: decreased Gait velocity interpretation: <1.8 ft/sec, indicative of risk for recurrent falls    Posture / Balance Dynamic Sitting Balance Sitting balance - Comments: patient with posterior lean and difficulty maintaining midline/ R bias Balance Overall balance assessment: Needs assistance Sitting-balance support: Feet supported Sitting balance-Leahy Scale: Poor Sitting balance - Comments: patient with posterior lean and difficulty maintaining midline/ R bias Postural control: Posterior lean, Right lateral lean Standing balance support: Bilateral upper extremity supported, During functional activity Standing balance-Leahy Scale: Poor Standing balance comment: R lateral lean. difficulty monitoring controlled movement patterns. Able to achieve midline, but unable to maintain  midline postural control    Special needs/care consideration Bowel mgmt: LBM 08/11/14 continent Bladder mgmt: continent Diabetic mgmt Hgb A1c 10.7 Needs sleep study as OP for OSA    Previous Home Environment Living Arrangements: Spouse/significant other  Lives With: Spouse Available Help at Discharge: Family Type of Home: House Home Layout: Multi-level, Bed/bath upstairs Alternate Level Stairs-Rails: Can reach both Alternate Level Stairs-Number of Steps: 12 Home Access: Stairs to enter Entrance Stairs-Rails: Can reach both Entrance Stairs-Number of Steps: 3 Bathroom Shower/Tub: Health visitor: Standard Bathroom Accessibility: Yes How Accessible: Accessible via walker Home Care Services: No  Discharge Living Setting Plans for Discharge Living Setting: Patient's home, Lives with (comment), Other (Comment) (wife) Type of Home at Discharge: House Discharge Home Layout: Multi-level, Bed/bath upstairs Alternate Level Stairs-Rails: Right, Left, Can reach both Alternate Level Stairs-Number of Steps: 12 Discharge Home Access: Stairs to enter Entrance Stairs-Rails: Can reach both, Right, Left Entrance Stairs-Number of Steps: 3 Discharge Bathroom Shower/Tub: Walk-in shower Discharge Bathroom Toilet: Standard Discharge Bathroom Accessibility: Yes How Accessible: Accessible via walker Does the patient have any problems obtaining your medications?: No  Social/Family/Support Systems Patient Roles: Spouse, Parent Contact Information: Velta Addison Anticipated Caregiver: wife and family Anticipated Caregiver's Contact Information: see above Ability/Limitations of Caregiver: no limitations Caregiver Availability: 24/7 Discharge Plan Discussed with Primary Caregiver: Yes Is Caregiver In Agreement  with Plan?: Yes Does Caregiver/Family have Issues with Lodging/Transportation while Pt is in Rehab?: No  Goals/Additional Needs Patient/Family Goal for Rehab: Mod I with PT  and OT Expected length of stay: [facility]OS 8-12 days Cultural Considerations: none Dietary Needs: heart healthy diet Equipment Needs: to be determined Pt/Family Agrees to Admission and willing to participate: Yes Program Orientation Provided & Reviewed with Pt/Caregiver Including Roles  & Responsibilities: Yes  Decrease burden of Care through IP rehab admission: n/a  Possible need for SNF placement upon discharge:not anticipated  Patient Condition: This patient's condition remains as documented in the consult dated 08/11/14, in which the Rehabilitation Physician determined and documented that the patient's condition is appropriate for intensive rehabilitative care in an inpatient rehabilitation facility. Will admit to inpatient rehab today.  Preadmission Screen Completed By:  Oswald Hillock, 08/12/2014 10:00 AM ______________________________________________________________________   Discussed status with Dr. Riley Kill on 08-12-14 at 1000 and received telephone approval for admission today.  Admission Coordinator:  Campbell Lerner Dene Nazir, PT, time1000/Date 08-12-14

## 2014-08-11 NOTE — Progress Notes (Signed)
STROKE TEAM PROGRESS NOTE   SUBJECTIVE (INTERVAL HISTORY) The patient's wife and daughter are present. The patient is scheduled to go to the inpatient rehabilitation center today.   OBJECTIVE Temp:  [97.9 F (36.6 C)-98.7 F (37.1 C)] 98.4 F (36.9 C) (08/04 1335) Pulse Rate:  [75-89] 76 (08/04 1335) Cardiac Rhythm:  [-] Normal sinus rhythm (08/04 0840) Resp:  [18-20] 20 (08/04 1335) BP: (153-180)/(67-93) 153/80 mmHg (08/04 1335) SpO2:  [95 %-99 %] 98 % (08/04 1335)   Recent Labs Lab 08/10/14 1258 08/10/14 1637 08/10/14 2158 08/11/14 0642 08/11/14 1124  GLUCAP 221* 223* 213* 208* 138*    Recent Labs Lab 08/09/14 1013 08/09/14 1023  NA 136 140  K 4.0 3.9  CL 105 103  CO2 22  --   GLUCOSE 197* 208*  BUN 22* 24*  CREATININE 1.58* 1.40*  CALCIUM 8.6*  --     Recent Labs Lab 08/09/14 1013  AST 23  ALT 21  ALKPHOS 90  BILITOT 0.8  PROT 6.3*  ALBUMIN 3.4*    Recent Labs Lab 08/09/14 1013 08/09/14 1023  WBC 8.6  --   NEUTROABS 5.8  --   HGB 13.7 14.6  HCT 40.3 43.0  MCV 83.6  --   PLT 248  --    No results for input(s): CKTOTAL, CKMB, CKMBINDEX, TROPONINI in the last 168 hours.  Recent Labs  08/09/14 1013  LABPROT 14.6  INR 1.12   No results for input(s): COLORURINE, LABSPEC, PHURINE, GLUCOSEU, HGBUR, BILIRUBINUR, KETONESUR, PROTEINUR, UROBILINOGEN, NITRITE, LEUKOCYTESUR in the last 72 hours.  Invalid input(s): APPERANCEUR     Component Value Date/Time   CHOL 160 08/10/2014 0238   TRIG 177* 08/10/2014 0238   HDL 34* 08/10/2014 0238   CHOLHDL 4.7 08/10/2014 0238   VLDL 35 08/10/2014 0238   LDLCALC 91 08/10/2014 0238   Lab Results  Component Value Date   HGBA1C 10.7* 08/10/2014      Component Value Date/Time   LABOPIA NONE DETECTED 08/10/2014 2148   COCAINSCRNUR NONE DETECTED 08/10/2014 2148   LABBENZ NONE DETECTED 08/10/2014 2148   AMPHETMU NONE DETECTED 08/10/2014 2148   THCU NONE DETECTED 08/10/2014 2148   LABBARB NONE DETECTED  08/10/2014 2148    No results for input(s): ETH in the last 168 hours.  I have personally reviewed the radiological images below and agree with the radiology interpretations.  Ct Head Wo Contrast  08/09/2014    IMPRESSION: No acute intracranial abnormality     Mri and Mra Brain Wo Contrast  08/10/2014   IMPRESSION: 1. 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 2. MRA negative for treatable stenosis or occlusion. 3. 2 mm left carotid terminus outpouching which could reflect infundibulum or aneurysm. 4. Bifrontal gliosis, pattern consistent with remote cerebral contusions.   Carotid Doppler  Bilateral: 1-39% ICA stenosis. Vertebral artery flow is antegrade.  2D Echocardiogram  08/10/2014 Study Conclusions - Left ventricle: The cavity size was normal. There was moderate concentric hypertrophy. Systolic function was normal. The estimated ejection fraction was in the range of 60% to 65%. Wall motion was normal; there were no regional wall motion abnormalities. Doppler parameters are consistent with abnormal left ventricular relaxation (grade 1 diastolic dysfunction). There was no evidence of elevated ventricular filling pressure by Doppler parameters. - Aortic valve: Trileaflet; moderately thickened, mildly calcified leaflets. Valve mobility was restricted. There was mild stenosis. Mean gradient (S): 11 mm Hg. Peak gradient (S): 18 mm Hg. Valve area (VTI):  1.88 cm^2. Valve area (Vmax): 1.49 cm^2. Valve area (Vmean): 1.19 cm^2. - Aortic root: The aortic root was normal in size. - Mitral valve: Calcified annulus. Mildly thickened leaflets . - Left atrium: The atrium was normal in size. - Right ventricle: The cavity size was normal. Wall thickness was normal. Systolic function was normal. - Right atrium: The atrium was normal in size. - Tricuspid valve: Structurally normal valve. There was  mild regurgitation. - Pulmonic valve: There was no regurgitation. - Pulmonary arteries: Systolic pressure was within the normal range. - Inferior vena cava: The vessel was normal in size. - Pericardium, extracardiac: There was no pericardial effusion.   PHYSICAL EXAM  Temp:  [97.9 F (36.6 C)-98.7 F (37.1 C)] 98.4 F (36.9 C) (08/04 1335) Pulse Rate:  [75-89] 76 (08/04 1335) Resp:  [18-20] 20 (08/04 1335) BP: (153-180)/(67-93) 153/80 mmHg (08/04 1335) SpO2:  [95 %-99 %] 98 % (08/04 1335)  General - Well nourished, well developed, in no apparent distress.  Ophthalmologic - Sharp disc margins OU.  Cardiovascular - Regular rate and rhythm with no murmur.  Neck - supple, no carotid bruits  Mental Status -  Level of arousal and orientation to time, place, and person were intact. Language including expression, naming, repetition, comprehension was assessed and found intact. Fund of Knowledge was assessed and was intact.  Cranial Nerves II - XII - II - Visual field intact OU. III, IV, VI - Extraocular movements intact. V - Facial sensation intact bilaterally. VII - Facial movement intact bilaterally, but right nasolabial fold flattening. VIII - Hearing & vestibular intact bilaterally. X - Palate elevates symmetrically. XI - Chin turning & shoulder shrug intact bilaterally. XII - Tongue protrusion intact.  Motor Strength - The patient's strength was normal in all extremities except right hand dexterity difficulty and mildly weak grip and left foot drop and pronator drift was present on the right.  Bulk was normal and fasciculations were absent.   Motor Tone - Muscle tone was assessed at the neck and appendages and was normal.  Reflexes - The patient's reflexes were symmetrical in all extremities and he had no pathological reflexes.  Sensory - Light touch, temperature/pinprick were assessed and were symmetrical.    Coordination - The patient had normal movements in the  hands and feet with no ataxia or dysmetria.  Tremor was absent.  Gait and Station - deferred due to safety concerns.   ASSESSMENT/PLAN [name] is a 74 y.o. male with history of HTN, DM, HLD admitted for right sided weakness. Symptoms unchanged.    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 No cardiac source of emboli identified.  LDL 91  HgbA1c 10.7  SCDs for VTE prophylaxis  Diet heart healthy/carb modified Room service appropriate?: Yes; Fluid consistency:: Thin   aspirin 81 mg orally every day prior to admission, now on plavix 75mg  daily. Continue plavix on discharge.  Patient counseled to be compliant with his antithrombotic medications  Ongoing aggressive stroke risk factor management  Therapy recommendations:  CIR  Disposition:  CIR today  Diabetes  HgbA1c 10.7 goal < 7.0  Uncontrolled  Currently on levemir and novolog  CBG monitoring  SSI  DM education  Hypertension  Home meds:   Coreg, losartan, hctz Permissive hypertension - gradually normalized within 5-7 days. Currently on coreg  Will start losartan  Patient counseled to be compliant with his blood pressure medications  Hyperlipidemia  Home meds:  lipitor 40   Currently on lipitor 40  LDL 91, goal < 70  Increase to lipitor 80  Continue statin at discharge  Other Stroke Risk Factors  Advanced age  Sign of obstructive sleep apnea, need sleep study as OP  Other Active Problems  UDS negative  Other Pertinent History  Need sleep study as OP for OSA  Candidate for Stroke AF trial.   Follow-up with Dr. Roda Shutters in 2 months  Hospital day # 2  Marvel Plan, MD PhD Stroke Neurology 08/11/2014 2:20 PM    To contact Stroke Continuity provider, please refer to [email or web address]. After hours, contact General Neurology

## 2014-08-11 NOTE — H&P (Signed)
Physical Medicine and Rehabilitation Admission H&P    Chief Complaint  Patient presents with  . Right sided weakness, slurred speech, facial droop    HPI:  George Stevens is a 74 y.o. Right handed male with history of HTN, DM type 2, who was admitted on 08/09/2014 with right-sided weakness, facial droop and slurred speech.  MRI of the head showed acute nonhemorrhagic perforator infarct on the left favoring anterior chorodial over lateral lenticulostriate distribution and bifrontal gliosis c/w remote cerebral contusions. MRA was negative for stenosis or occlusion. Carotid Dopplers without ICA stenosis. 2D echo with ejection fraction of 65% no wall motion abnormalities with grade 1 diastolic dysfunction.  Patient did receive TPA and was started on Unasyn due to cellulitis from recent cat bite. . Dr. 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 Sleep study recommended on outpatient basis due to concerns of sleep apnea.   Patient with resultant dysarthria as well as right sided weakness, right lean with unsteady gait as well as difficulty with recall, problem solving and processing. CIR recommended by MD and rehab team and patient admitted today.    Review of Systems  HENT: Positive for hearing loss.   Eyes: Negative for blurred vision and double vision.  Respiratory: Negative for cough, shortness of breath and wheezing.   Cardiovascular: Negative for chest pain and palpitations.  Gastrointestinal: Positive for heartburn, diarrhea and constipation. Negative for blood in stool.       Bowel urgency with occasional incontinence.  Lacks sense of taste due to HI.   Genitourinary: Positive for frequency. Negative for dysuria.       H/o hesitancy and urgency  Musculoskeletal: Positive for joint pain (left knee pain). Negative for myalgias and neck pain.  Neurological: Positive for dizziness (with upward gaze at tmes. ),  speech change and focal weakness. Negative for sensory change and headaches.  Psychiatric/Behavioral: Positive for depression (since stroke. ). Negative for memory loss. The patient is nervous/anxious (Has anxiety due to bowel issues. ). The patient does not have insomnia (snores at nights).       Past Medical History  Diagnosis Date  . Hypertension   . Diabetes mellitus without complication   . History of colon polyps   . Hesitancy of micturition   . Concussion with brief LOC     Syncope X 1 in 1996. Another episode with fall and loss of sense of taste as well as brief memory deficits.     Past Surgical History  Procedure Laterality Date  . Cataract extraction    . Colonoscopy w/ polypectomy      Family History  Problem Relation Age of Onset  . Hypertension Mother   . Hypertension Father   . Leukemia Brother   . Diabetes Father   . Emphysema Father     Social History:  Married. Retired Designer, television/film set.  Independent but sedentary. He reports that he smokes Pipe once every couple of months.  He used to smoke cigars in the past. He has never used smokeless tobacco. He reports that he drinks a 6 pack over 2 months. He reports that he does not use illicit drugs.    Allergies: No Known Allergies    Medications Prior to Admission  Medication Sig Dispense Refill  . aspirin 81 MG chewable tablet Chew 81 mg by mouth daily.    Marland Kitchen atorvastatin (LIPITOR) 40 MG tablet Take 40 mg by mouth daily.    Marland Kitchen  carvedilol (COREG) 25 MG tablet Take 25 mg by mouth 2 (two) times daily with a meal.    . insulin detemir (LEVEMIR) 100 UNIT/ML injection Inject 70 Units into the skin at bedtime.    . insulin lispro (HUMALOG) 100 UNIT/ML injection Inject 25 Units into the skin 3 (three) times daily before meals.    Marland Kitchen losartan-hydrochlorothiazide (HYZAAR) 100-25 MG per tablet Take 1 tablet by mouth daily.    . metFORMIN (GLUCOPHAGE) 1000 MG tablet Take 1,000 mg by mouth daily with breakfast.    .  terazosin (HYTRIN) 5 MG capsule Take 5 mg by mouth at bedtime.      Home: Home Living Family/patient expects to be discharged to:: Private residence Living Arrangements: Spouse/significant other Available Help at Discharge: Family Type of Home: House Home Access: Stairs to enter Secretary/administrator of Steps: 3 Entrance Stairs-Rails: Can reach both Home Layout: Multi-level, Bed/bath upstairs Alternate Level Stairs-Number of Steps: 12 Alternate Level Stairs-Rails: Can reach both Bathroom Shower/Tub: Health visitor: Pharmacist, community: Yes Home Equipment: None  Lives With: Spouse   Functional History: Prior Function Level of Independence: Independent  Functional Status:  Mobility: Bed Mobility Overal bed mobility: Needs Assistance Bed Mobility: Rolling, Sidelying to Sit Rolling: Min assist Sidelying to sit: Min assist General bed mobility comments: patient able to roll to EOB with min assist and cues, improved ability to power up to sitting, min assist for stability at EOB. Transfers Overall transfer level: Needs assistance Equipment used: Rolling walker (2 wheeled) Transfers: Sit to/from Stand, [employer] Transfers Sit to Stand: Mod assist, +2 physical assistance Stand pivot transfers: Mod assist General transfer comment: Improved control of RLE for power up but easily fatigued and required cues for setting of RLE upon pivotal transfer. VCs for hand placement with assist for R grip Ambulation/Gait Ambulation/Gait assistance: Mod assist, +2 physical assistance Ambulation Distance (Feet): 16 Feet Assistive device: Rolling walker (2 wheeled) Gait Pattern/deviations: Step-through pattern, Decreased stride length, Ataxic, Decreased dorsiflexion - right, Decreased stance time - right, Decreased step length - right, Decreased weight shift to left, Narrow base of support General Gait Details: patient with poor coordination of giat, decreased ability  to stabilize stride length. Assist required to faciliate weight shift to the left for RLE advancement. Assist for upright posture and to maintain stability with RW. Poor overall functional coordiation, continuous cues for sequencing throughout ambulation, 8 steps x2 Gait velocity: decreased Gait velocity interpretation: <1.8 ft/sec, indicative of risk for recurrent falls    ADL: ADL Overall ADL's : Needs assistance/impaired Eating/Feeding: Set up Grooming: Minimal assistance Upper Body Bathing: Minimal assitance, Sitting Lower Body Bathing: Moderate assistance, Sit to/from stand Upper Body Dressing : Moderate assistance, Sitting Lower Body Dressing: Moderate assistance, Sit to/from stand Toilet Transfer: Moderate assistance Toileting- Clothing Manipulation and Hygiene: Moderate assistance Functional mobility during ADLs: Moderate assistance General ADL Comments: Pt very motivated to participate with ADL. Note poor midline orientation and trunk control during ADL. Motor impersistance during ADL. Initiates use of RUE.   Cognition: Cognition Overall Cognitive Status: Impaired/Different from baseline Arousal/Alertness: Awake/alert Orientation Level: Oriented X4 Attention: Selective Selective Attention: Impaired Selective Attention Impairment: Verbal basic Memory: Impaired Memory Impairment: Retrieval deficit, Decreased recall of new information Awareness: Appears intact Problem Solving: Impaired Problem Solving Impairment: Verbal complex Safety/Judgment: Appears intact Cognition Arousal/Alertness: Awake/alert Behavior During Therapy: WFL for tasks assessed/performed Overall Cognitive Status: Impaired/Different from baseline Area of Impairment: Problem solving Problem Solving: Slow processing, Requires verbal cues   Blood pressure  182/84, pulse 92, temperature 97.9 F (36.6 C), temperature source Oral, resp. rate 20, height 5\' 5"  (1.651 m), weight 93.2 kg (205 lb 7.5 oz), SpO2 98  %. Physical Exam  Nursing note and vitals reviewed. Constitutional: He is oriented to person, place, and time. He appears well-developed.  HENT:  Head: Normocephalic and atraumatic.  Eyes: Conjunctivae are normal. Pupils are equal, round, and reactive to light.  Neck: Normal range of motion. Neck supple. No JVD present. No tracheal deviation present. No thyromegaly present.  Cardiovascular: Normal rate and regular rhythm.   No murmur heard. Respiratory: Effort normal and breath sounds normal. No respiratory distress. He has no wheezes. He has no rales.  GI: Soft. Bowel sounds are normal. He exhibits no distension. There is no tenderness.  Musculoskeletal: Normal range of motion. He exhibits no tenderness.  Right hand with dry scab from prior cat bite. No erythema or tenderness. Tenderness along lateral portion of left patella, mild erythema and warmth is noted there---reproduces pain with palpation and rom at knee.   Neurological: He is alert and oriented to person, place, and time.  Right facial weakness with minimal dysarthria. Follows basic one and two step commands without difficulty. Mild dysarthria.  Minimal right 7. Cognitively appears appropriate. RUE 4-/5 detoid, 4/d biceps, triceps, wrist, fingers with pronator drift. RLE: 3+ HF, 4-KE and 4/5 ADF/APF. Decreased FMC on right remains. Senses pain and light touch although may be somewhat decreased in distal legs/feet. DTR's trace to 1+. No resting tone. No Babinski   Skin: Skin is warm and dry. No rash noted. No erythema.  Psychiatric: He has a normal mood and affect. His behavior is normal.    Results for orders placed or performed during the hospital encounter of 08/09/14 (from the past 48 hour(s))  Glucose, capillary     Status: Abnormal   Collection Time: 08/10/14 12:58 PM  Result Value Ref Range   Glucose-Capillary 221 (H) 65 - 99 mg/dL  Glucose, capillary     Status: Abnormal   Collection Time: 08/10/14  4:37 PM  Result Value  Ref Range   Glucose-Capillary 223 (H) 65 - 99 mg/dL   Comment 1 Notify RN    Comment 2 Document in Chart   Urine rapid drug screen (hosp performed)     Status: None   Collection Time: 08/10/14  9:48 PM  Result Value Ref Range   Opiates NONE DETECTED NONE DETECTED   Cocaine NONE DETECTED NONE DETECTED   Benzodiazepines NONE DETECTED NONE DETECTED   Amphetamines NONE DETECTED NONE DETECTED   Tetrahydrocannabinol NONE DETECTED NONE DETECTED   Barbiturates NONE DETECTED NONE DETECTED    Comment:        DRUG SCREEN FOR MEDICAL PURPOSES ONLY.  IF CONFIRMATION IS NEEDED FOR ANY PURPOSE, NOTIFY LAB WITHIN 5 DAYS.        LOWEST DETECTABLE LIMITS FOR URINE DRUG SCREEN Drug Class       Cutoff (ng/mL) Amphetamine      1000 Barbiturate      200 Benzodiazepine   200 Tricyclics       300 Opiates          300 Cocaine          300 THC              50   Glucose, capillary     Status: Abnormal   Collection Time: 08/10/14  9:58 PM  Result Value Ref Range   Glucose-Capillary 213 (H) 65 - 99  mg/dL   Comment 1 Notify RN    Comment 2 Document in Chart   Glucose, capillary     Status: Abnormal   Collection Time: 08/11/14  6:42 AM  Result Value Ref Range   Glucose-Capillary 208 (H) 65 - 99 mg/dL   Comment 1 Notify RN    Comment 2 Document in Chart   Glucose, capillary     Status: Abnormal   Collection Time: 08/11/14 11:24 AM  Result Value Ref Range   Glucose-Capillary 138 (H) 65 - 99 mg/dL   Comment 1 Notify RN    Comment 2 Document in Chart   Glucose, capillary     Status: None   Collection Time: 08/11/14  3:46 PM  Result Value Ref Range   Glucose-Capillary 65 65 - 99 mg/dL  Glucose, capillary     Status: Abnormal   Collection Time: 08/11/14  4:20 PM  Result Value Ref Range   Glucose-Capillary 121 (H) 65 - 99 mg/dL  Glucose, capillary     Status: Abnormal   Collection Time: 08/11/14  9:18 PM  Result Value Ref Range   Glucose-Capillary 148 (H) 65 - 99 mg/dL   Comment 1 Notify RN     Comment 2 Document in Chart   Glucose, capillary     Status: None   Collection Time: 08/12/14  6:41 AM  Result Value Ref Range   Glucose-Capillary 77 65 - 99 mg/dL   Comment 1 Notify RN    Comment 2 Document in Chart    Mr Brain Wo Contrast  08/10/2014   CLINICAL DATA:  Stroke with dysarthria, right facial droop common right leg weakness. TPA given.  EXAM: MRI HEAD WITHOUT CONTRAST  MRA HEAD WITHOUT CONTRAST  TECHNIQUE: Multiplanar, multiecho pulse sequences of the brain and surrounding structures were obtained without intravenous contrast. Angiographic images of the head were obtained using MRA technique without contrast.  COMPARISON:  None.  FINDINGS: MRI HEAD FINDINGS  Calvarium and upper cervical spine: No focal marrow signal abnormality.  Orbits: Bilateral cataract resection.  Sinuses and Mastoids: Clear. Mastoid and middle ears are clear.  Brain: Restricted diffusion in the posterior left putamen, internal capsule, up to the posterior body of the caudate nucleus. Favor anterior choroidal distribution upper lateral lenticulostriate given the posterior appearance, even though there is sparing along the mesial temporal lobe and involvement of the posterior putamen. No superimposed hemorrhage. No additional infarct.  Mild chronic small vessel disease with ischemic gliosis patchy throughout the bilateral cerebral white matter. There is dense gliosis and encephalomalacia in the inferior and polar right more than left frontal lobes, cortical and subcortical.  No evidence of major vessel occlusion, hydrocephalus, mass lesion.  MRA HEAD FINDINGS  Anterior and left posterior communicating arteries are present. Strong dominance of the left vertebral artery.  No definite major branch occlusion, stenosis, or vascular malformation. A 2 mm outpouching from the left carotid terminus, directed inferiorly and anteriorly could reflect a infundibulum (possibly of an occluded anterior choroidal artery) or aneurysm. The  outpouching is beyond the left posterior communicating artery origin.  IMPRESSION: 1. 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 2. MRA negative for treatable stenosis or occlusion. 3. 2 mm left carotid terminus outpouching which could reflect infundibulum or aneurysm. 4. Bifrontal gliosis, pattern consistent with remote cerebral contusions.   Electronically Signed   By: Marnee Spring M.D.   On: 08/10/2014 12:34   Mr Maxine Glenn Head/brain Wo Cm  08/10/2014  CLINICAL DATA:  Stroke with dysarthria, right facial droop common right leg weakness. TPA given.  EXAM: MRI HEAD WITHOUT CONTRAST  MRA HEAD WITHOUT CONTRAST  TECHNIQUE: Multiplanar, multiecho pulse sequences of the brain and surrounding structures were obtained without intravenous contrast. Angiographic images of the head were obtained using MRA technique without contrast.  COMPARISON:  None.  FINDINGS: MRI HEAD FINDINGS  Calvarium and upper cervical spine: No focal marrow signal abnormality.  Orbits: Bilateral cataract resection.  Sinuses and Mastoids: Clear. Mastoid and middle ears are clear.  Brain: Restricted diffusion in the posterior left putamen, internal capsule, up to the posterior body of the caudate nucleus. Favor anterior choroidal distribution upper lateral lenticulostriate given the posterior appearance, even though there is sparing along the mesial temporal lobe and involvement of the posterior putamen. No superimposed hemorrhage. No additional infarct.  Mild chronic small vessel disease with ischemic gliosis patchy throughout the bilateral cerebral white matter. There is dense gliosis and encephalomalacia in the inferior and polar right more than left frontal lobes, cortical and subcortical.  No evidence of major vessel occlusion, hydrocephalus, mass lesion.  MRA HEAD FINDINGS  Anterior and left posterior communicating arteries are present. Strong dominance of the left  vertebral artery.  No definite major branch occlusion, stenosis, or vascular malformation. A 2 mm outpouching from the left carotid terminus, directed inferiorly and anteriorly could reflect a infundibulum (possibly of an occluded anterior choroidal artery) or aneurysm. The outpouching is beyond the left posterior communicating artery origin.  IMPRESSION: 1. 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 2. MRA negative for treatable stenosis or occlusion. 3. 2 mm left carotid terminus outpouching which could reflect infundibulum or aneurysm. 4. Bifrontal gliosis, pattern consistent with remote cerebral contusions.   Electronically Signed   By: Marnee Spring M.D.   On: 08/10/2014 12:34       Medical Problem List and Plan: 1. Functional deficits secondary to left basal ganglia/corona radiata infarct 2.  DVT Prophylaxis/Anticoagulation: Pharmaceutical: Lovenox--indicated due to immobility 3. Pain Management: N/A.  4. Mood: Team to provide ego support. LCSW to follow for evaluation and support.  5. Neuropsych: This patient is capable of making decisions on his own behalf. 6. Skin/Wound Care: Routine pressure relief measures.  Maintain adequate fluid and nutritional intake.  7. Fluids/Electrolytes/Nutrition: Monitor I/O.  Check lytes in am. Offer supplements prn poor intake.  8. DM type 2: Poorly controlled due to dietary non-compliance. Will get RD to help with diet education.  Will monitor BS with ac/hs CBG checks.  Continue levemir at bedtime with SSI for tighter blood sugar control. Continue to hold metformin due to renal insufficiency CKD. 9. HTN: Monitor BP every 8 hours.  Continue losartan, coreg and hytrin. Titrate medications as needed.  10. Cellulitis due to cat bite: Continue Augmentin--antibiotic Day #4/7---thru monday 11. CKD?: Baseline BUN/Cr- 22/1.58. 12. Dyslipidemia: continue Crestor.  13. Low protein stores:  Will add prosource supplements.  4. Left knee pain: potential gout flare. Will try a couple of doses of colchicine. Check uric acid level   Post Admission Physician Evaluation: 1. Functional deficits secondary  to  left basal ganglia/corona radiata infarct 2. Patient is admitted to receive collaborative, interdisciplinary care between the physiatrist, rehab nursing staff, and therapy team. 3. Patient's level of medical complexity and substantial therapy needs in context of that medical necessity cannot be provided at a lesser intensity of care such as a SNF. 4. Patient has experienced substantial functional loss  from his/her baseline which was documented above under the "Functional History" and "Functional Status" headings.  Judging by the patient's diagnosis, physical exam, and functional history, the patient has potential for functional progress which will result in measurable gains while on inpatient rehab.  These gains will be of substantial and practical use upon discharge  in facilitating mobility and self-care at the household level. 5. Physiatrist will provide 24 hour management of medical needs as well as oversight of the therapy plan/treatment and provide guidance as appropriate regarding the interaction of the two. 6. 24 hour rehab nursing will assist with bladder management, bowel management, safety, skin/wound care, disease management, medication administration, pain management and patient education  and help integrate therapy concepts, techniques,education, etc. 7. PT will assess and treat for/with: Lower extremity strength, range of motion, stamina, balance, functional mobility, safety, adaptive techniques and equipment, NMR, visual-spatial awarenes, stroke education.   Goals are: mod I. 8. OT will assess and treat for/with: ADL's, functional mobility, safety, upper extremity strength, adaptive techniques and equipment, NMR, visual-spatial awareness, stroke education, ego support.   Goals  are: mod I. Therapy may proceed with showering this patient. 9. SLP will assess and treat for/with: n/a.  Goals are: n/a. 10. Case Management and Social Worker will assess and treat for psychological issues and discharge planning. 11. Team conference will be held weekly to assess progress toward goals and to determine barriers to discharge. 12. Patient will receive at least 3 hours of therapy per day at least 5 days per week. 13. ELOS: 7-10 days       14. Prognosis:  excellent     Ranelle Oyster, MD, [facility] Physical Medicine & Rehabilitation 08/12/2014   08/12/2014

## 2014-08-11 NOTE — Progress Notes (Addendum)
I met with pt and his wife at bedside to discuss an inpt rehab admission. Both are in agreement. I clarified insurance payer is [place] [employer] for pt was listed as no insurance. I will notify financial counselor and preservice center so that I can begin authorization for an inpt rehab admission pending payer approval. RN CM are aware. [phone] OT eval needed for authorization which I have contacted acute department for eval.

## 2014-08-11 NOTE — Progress Notes (Signed)
Physical Therapy Treatment Patient Details Name: George Stevens MRN: [id number] DOB: 1940-10-01 Today's Date: 08/11/2014    History of Present Illness 74 y.o. male who woke up this AM feeling normal. He drove to his sons house and when he stood up he noted his right leg felt as if it was not working correctly. He noted these symptoms worsened and he was not stable on his feet. EMS was called. On arrival to ED He had dysarthria, right facial droop and right leg weakness.MRI postive for acute infarct.    PT Comments    Patient seen for progression of therapies. Patient with noted improvements in strength and function this session compared to previous evaluation. Patient does continue to demonstrate deficits limiting overall mobility. Patient required increased physical assist for all aspects of mobility but was able to perform standing activities, transfers, and some initial gait training. Feel patient is an ideal candidate for CIR program, will continue to see and progress as tolerated.   Follow Up Recommendations  CIR;Supervision/Assistance - 24 hour     Equipment Recommendations  Other (comment) (TBD)    Recommendations for Other Services Rehab consult     Precautions / Restrictions Precautions Precautions: Fall Restrictions Weight Bearing Restrictions: No    Mobility  Bed Mobility Overal bed mobility: Needs Assistance Bed Mobility: Rolling;Sidelying to Sit Rolling: Min assist Sidelying to sit: Min assist       General bed mobility comments: patient able to roll to EOB with min assist and cues, improved ability to power up to sitting, min assist for stability at EOB.  Transfers Overall transfer level: Needs assistance Equipment used: Rolling walker (2 wheeled) Transfers: Sit to/from [employer] Sit to Stand: Mod assist;+2 physical assistance Stand pivot transfers: Mod assist       General transfer comment: Improved control of RLE for power up but  easily fatigued and required cues for setting of RLE upon pivotal transfer. VCs for hand placement with assist for R grip  Ambulation/Gait Ambulation/Gait assistance: Mod assist;+2 physical assistance Ambulation Distance (Feet): 16 Feet Assistive device: Rolling walker (2 wheeled) Gait Pattern/deviations: Step-through pattern;Decreased stride length;Ataxic;Decreased dorsiflexion - right;Decreased stance time - right;Decreased step length - right;Decreased weight shift to left;Narrow base of support Gait velocity: decreased Gait velocity interpretation: <1.8 ft/sec, indicative of risk for recurrent falls General Gait Details: patient with poor coordination of giat, decreased ability to stabilize stride length. Assist required to faciliate weight shift to the left for RLE advancement. Assist for upright posture and to maintain stability with RW. Poor overall functional coordiation, continuous cues for sequencing throughout ambulation, 8 steps x2   Stairs            Wheelchair Mobility    Modified Rankin (Stroke Patients Only) Modified Rankin (Stroke Patients Only) Pre-Morbid Rankin Score: No symptoms Modified Rankin: Moderately severe disability     Balance Overall balance assessment: Needs assistance   Sitting balance-Leahy Scale: Poor Sitting balance - Comments: patient with posterior lean and difficulty maintaining midline/ R bias Postural control: Posterior lean;Right lateral lean Standing balance support: Bilateral upper extremity supported;During functional activity Standing balance-Leahy Scale: Poor Standing balance comment: R lateral lean. difficulty monitoring controlled movement patterns. Able to achieve midline, but unable to maintain midline postural control                    Cognition Arousal/Alertness: Awake/alert Behavior During Therapy: WFL for tasks assessed/performed Overall Cognitive Status: Impaired/Different from baseline Area of Impairment:  Problem solving  Problem Solving: Slow processing;Requires verbal cues      Exercises      General Comments        Pertinent Vitals/Pain Pain Assessment: No/denies pain    Home Living Family/patient expects to be discharged to:: Private residence Living Arrangements: Spouse/significant other Available Help at Discharge: Family Type of Home: House Home Access: Stairs to enter Entrance Stairs-Rails: Can reach both Home Layout: Multi-level;Bed/bath upstairs Home Equipment: None      Prior Function Level of Independence: Independent          PT Goals (current goals can now be found in the care plan section) Acute Rehab PT Goals Patient Stated Goal: to get better PT Goal Formulation: With patient/family Time For Goal Achievement: 08/24/14 Potential to Achieve Goals: Good Progress towards PT goals: Progressing toward goals    Frequency  Min 4X/week    PT Plan Current plan remains appropriate    Co-evaluation             End of Session Equipment Utilized During Treatment: Gait belt Activity Tolerance: Patient tolerated treatment well;No increased pain Patient left: in chair;with call bell/phone within reach;with chair alarm set;with family/visitor present     Time: 1030-1059 PT Time Calculation (min) (ACUTE ONLY): 29 min  Charges:  $Gait Training: 8-22 mins $Therapeutic Activity: 8-22 mins                    G CodesFabio Asa 08-29-2014, 4:10 PM Charlotte Crumb, PT DPT  [phone]

## 2014-08-12 ENCOUNTER — Encounter (HOSPITAL_COMMUNITY): Payer: Self-pay | Admitting: Physical Medicine and Rehabilitation

## 2014-08-12 ENCOUNTER — Inpatient Hospital Stay (HOSPITAL_COMMUNITY)
Admission: RE | Admit: 2014-08-12 | Discharge: 2014-08-25 | DRG: 057 | Disposition: A | Discharge status: Home / Self Care | Payer: Medicare HMO | Source: Intra-hospital | Attending: Physical Medicine & Rehabilitation | Admitting: Physical Medicine & Rehabilitation

## 2014-08-12 DIAGNOSIS — G8191 Hemiplegia, unspecified affecting right dominant side: Secondary | ICD-10-CM

## 2014-08-12 DIAGNOSIS — I639 Cerebral infarction, unspecified: Principal | ICD-10-CM

## 2014-08-12 DIAGNOSIS — I129 Hypertensive chronic kidney disease with stage 1 through stage 4 chronic kidney disease, or unspecified chronic kidney disease: Secondary | ICD-10-CM | POA: Diagnosis not present

## 2014-08-12 DIAGNOSIS — Z7982 Long term (current) use of aspirin: Secondary | ICD-10-CM | POA: Diagnosis not present

## 2014-08-12 DIAGNOSIS — I69392 Facial weakness following cerebral infarction: Secondary | ICD-10-CM

## 2014-08-12 DIAGNOSIS — R350 Frequency of micturition: Secondary | ICD-10-CM | POA: Diagnosis not present

## 2014-08-12 DIAGNOSIS — Z9111 Patient's noncompliance with dietary regimen: Secondary | ICD-10-CM

## 2014-08-12 DIAGNOSIS — N189 Chronic kidney disease, unspecified: Secondary | ICD-10-CM

## 2014-08-12 DIAGNOSIS — M109 Gout, unspecified: Secondary | ICD-10-CM | POA: Diagnosis present

## 2014-08-12 DIAGNOSIS — M1000 Idiopathic gout, unspecified site: Secondary | ICD-10-CM | POA: Diagnosis present

## 2014-08-12 DIAGNOSIS — I69322 Dysarthria following cerebral infarction: Secondary | ICD-10-CM

## 2014-08-12 DIAGNOSIS — I10 Essential (primary) hypertension: Secondary | ICD-10-CM | POA: Diagnosis not present

## 2014-08-12 DIAGNOSIS — Z79899 Other long term (current) drug therapy: Secondary | ICD-10-CM | POA: Diagnosis not present

## 2014-08-12 DIAGNOSIS — I6381 Other cerebral infarction due to occlusion or stenosis of small artery: Secondary | ICD-10-CM | POA: Diagnosis present

## 2014-08-12 DIAGNOSIS — E1165 Type 2 diabetes mellitus with hyperglycemia: Secondary | ICD-10-CM

## 2014-08-12 DIAGNOSIS — IMO0002 Reserved for concepts with insufficient information to code with codable children: Secondary | ICD-10-CM | POA: Diagnosis present

## 2014-08-12 DIAGNOSIS — Z794 Long term (current) use of insulin: Secondary | ICD-10-CM | POA: Diagnosis not present

## 2014-08-12 DIAGNOSIS — E785 Hyperlipidemia, unspecified: Secondary | ICD-10-CM

## 2014-08-12 DIAGNOSIS — W5501XD Bitten by cat, subsequent encounter: Secondary | ICD-10-CM

## 2014-08-12 DIAGNOSIS — F17290 Nicotine dependence, other tobacco product, uncomplicated: Secondary | ICD-10-CM

## 2014-08-12 DIAGNOSIS — L0390 Cellulitis, unspecified: Secondary | ICD-10-CM

## 2014-08-12 DIAGNOSIS — G8190 Hemiplegia, unspecified affecting unspecified side: Secondary | ICD-10-CM | POA: Diagnosis not present

## 2014-08-12 DIAGNOSIS — L03113 Cellulitis of right upper limb: Secondary | ICD-10-CM | POA: Diagnosis not present

## 2014-08-12 DIAGNOSIS — R269 Unspecified abnormalities of gait and mobility: Secondary | ICD-10-CM

## 2014-08-12 DIAGNOSIS — I69351 Hemiplegia and hemiparesis following cerebral infarction affecting right dominant side: Secondary | ICD-10-CM | POA: Diagnosis present

## 2014-08-12 LAB — GLUCOSE, CAPILLARY
GLUCOSE-CAPILLARY: 77 mg/dL (ref 65–99)
Glucose-Capillary: 188 mg/dL — ABNORMAL HIGH (ref 65–99)
Glucose-Capillary: 234 mg/dL — ABNORMAL HIGH (ref 65–99)
Glucose-Capillary: 307 mg/dL — ABNORMAL HIGH (ref 65–99)

## 2014-08-12 MED ORDER — CLOPIDOGREL BISULFATE 75 MG PO TABS
75.0000 mg | ORAL_TABLET | Freq: Every day | ORAL | Status: DC
  Administered 2014-08-13 – 2014-08-25 (×13): 75 mg via ORAL
  Filled 2014-08-12 (×13): qty 1

## 2014-08-12 MED ORDER — INSULIN ASPART 100 UNIT/ML ~~LOC~~ SOLN
0.0000 [IU] | Freq: Every day | SUBCUTANEOUS | Status: DC
  Administered 2014-08-12: 4 [IU] via SUBCUTANEOUS
  Administered 2014-08-13: 3 [IU] via SUBCUTANEOUS
  Administered 2014-08-14 – 2014-08-24 (×6): 2 [IU] via SUBCUTANEOUS

## 2014-08-12 MED ORDER — LOSARTAN POTASSIUM 50 MG PO TABS
100.0000 mg | ORAL_TABLET | Freq: Every day | ORAL | Status: DC
  Administered 2014-08-13 – 2014-08-18 (×6): 100 mg via ORAL
  Filled 2014-08-12 (×7): qty 2

## 2014-08-12 MED ORDER — ACETAMINOPHEN 325 MG PO TABS
325.0000 mg | ORAL_TABLET | ORAL | Status: DC | PRN
  Administered 2014-08-13 – 2014-08-15 (×2): 650 mg via ORAL
  Filled 2014-08-12 (×2): qty 2

## 2014-08-12 MED ORDER — INSULIN DETEMIR 100 UNIT/ML ~~LOC~~ SOLN
50.0000 [IU] | Freq: Every day | SUBCUTANEOUS | Status: DC
  Filled 2014-08-12: qty 0.5

## 2014-08-12 MED ORDER — TRAZODONE HCL 50 MG PO TABS: 25.0000 mg | ORAL_TABLET | Freq: Every evening | ORAL | Status: DC | PRN

## 2014-08-12 MED ORDER — TERAZOSIN HCL 5 MG PO CAPS
5.0000 mg | ORAL_CAPSULE | Freq: Every day | ORAL | Status: DC
  Administered 2014-08-12 – 2014-08-16 (×5): 5 mg via ORAL
  Filled 2014-08-12 (×7): qty 1

## 2014-08-12 MED ORDER — AMOXICILLIN-POT CLAVULANATE 500-125 MG PO TABS
1.0000 | ORAL_TABLET | Freq: Two times a day (BID) | ORAL | Status: AC
  Administered 2014-08-13 – 2014-08-15 (×5): 500 mg via ORAL
  Filled 2014-08-12 (×6): qty 1

## 2014-08-12 MED ORDER — PROCHLORPERAZINE EDISYLATE 5 MG/ML IJ SOLN: 5.0000 mg | Freq: Four times a day (QID) | INTRAMUSCULAR | Status: DC | PRN

## 2014-08-12 MED ORDER — ATORVASTATIN CALCIUM 80 MG PO TABS
80.0000 mg | ORAL_TABLET | Freq: Every day | ORAL | Status: DC
  Administered 2014-08-13 – 2014-08-25 (×13): 80 mg via ORAL
  Filled 2014-08-12 (×13): qty 1

## 2014-08-12 MED ORDER — FLEET ENEMA 7-19 GM/118ML RE ENEM: 1.0000 | ENEMA | Freq: Once | RECTAL | Status: AC | PRN

## 2014-08-12 MED ORDER — PROCHLORPERAZINE MALEATE 5 MG PO TABS: 5.0000 mg | ORAL_TABLET | Freq: Four times a day (QID) | ORAL | Status: DC | PRN

## 2014-08-12 MED ORDER — ENOXAPARIN SODIUM 40 MG/0.4ML ~~LOC~~ SOLN
40.0000 mg | SUBCUTANEOUS | Status: DC
  Administered 2014-08-12 – 2014-08-24 (×13): 40 mg via SUBCUTANEOUS
  Filled 2014-08-12 (×13): qty 0.4

## 2014-08-12 MED ORDER — DIPHENHYDRAMINE HCL 12.5 MG/5ML PO ELIX: 12.5000 mg | ORAL_SOLUTION | Freq: Four times a day (QID) | ORAL | Status: DC | PRN

## 2014-08-12 MED ORDER — SENNOSIDES-DOCUSATE SODIUM 8.6-50 MG PO TABS: 1.0000 | ORAL_TABLET | Freq: Every evening | ORAL | Status: DC | PRN

## 2014-08-12 MED ORDER — PANTOPRAZOLE SODIUM 40 MG PO TBEC
40.0000 mg | DELAYED_RELEASE_TABLET | Freq: Every day | ORAL | Status: DC
  Administered 2014-08-13 – 2014-08-25 (×13): 40 mg via ORAL
  Filled 2014-08-12 (×13): qty 1

## 2014-08-12 MED ORDER — PROCHLORPERAZINE 25 MG RE SUPP: 12.5000 mg | Freq: Four times a day (QID) | RECTAL | Status: DC | PRN

## 2014-08-12 MED ORDER — BISACODYL 10 MG RE SUPP: 10.0000 mg | Freq: Every day | RECTAL | Status: DC | PRN

## 2014-08-12 MED ORDER — INSULIN DETEMIR 100 UNIT/ML ~~LOC~~ SOLN
50.0000 [IU] | Freq: Every day | SUBCUTANEOUS | Status: DC
  Administered 2014-08-12 – 2014-08-24 (×13): 50 [IU] via SUBCUTANEOUS
  Filled 2014-08-12 (×15): qty 0.5

## 2014-08-12 MED ORDER — GUAIFENESIN-DM 100-10 MG/5ML PO SYRP: 5.0000 mL | ORAL_SOLUTION | Freq: Four times a day (QID) | ORAL | Status: DC | PRN

## 2014-08-12 MED ORDER — COLCHICINE 0.6 MG PO TABS
0.6000 mg | ORAL_TABLET | Freq: Every day | ORAL | Status: AC
  Administered 2014-08-12 – 2014-08-14 (×3): 0.6 mg via ORAL
  Filled 2014-08-12 (×3): qty 1

## 2014-08-12 MED ORDER — INSULIN ASPART 100 UNIT/ML ~~LOC~~ SOLN
0.0000 [IU] | Freq: Three times a day (TID) | SUBCUTANEOUS | Status: DC
  Administered 2014-08-12 – 2014-08-14 (×6): 3 [IU] via SUBCUTANEOUS
  Administered 2014-08-14 – 2014-08-15 (×2): 7 [IU] via SUBCUTANEOUS
  Administered 2014-08-15 – 2014-08-16 (×2): 3 [IU] via SUBCUTANEOUS
  Administered 2014-08-16 – 2014-08-17 (×2): 2 [IU] via SUBCUTANEOUS
  Administered 2014-08-17: 1 [IU] via SUBCUTANEOUS
  Administered 2014-08-19 (×2): 2 [IU] via SUBCUTANEOUS

## 2014-08-12 MED ORDER — CARVEDILOL 25 MG PO TABS
25.0000 mg | ORAL_TABLET | Freq: Two times a day (BID) | ORAL | Status: DC
  Administered 2014-08-12 – 2014-08-25 (×26): 25 mg via ORAL
  Filled 2014-08-12 (×26): qty 1

## 2014-08-12 MED ORDER — ALUM & MAG HYDROXIDE-SIMETH 200-200-20 MG/5ML PO SUSP: 30.0000 mL | ORAL | Status: DC | PRN

## 2014-08-12 NOTE — Progress Notes (Signed)
Patient is being transferred to rehab. Report called to the receiving nurse. 

## 2014-08-12 NOTE — Progress Notes (Addendum)
Speech Language Pathology Treatment: Cognitive-Linquistic  Patient Details Name: George Stevens MRN: [id number] DOB: 03-21-1940 Today's Date: 08/12/2014 Time: 4098-1191 SLP Time Calculation (min) (ACUTE ONLY): 25 min  Assessment / Plan / Recommendation Clinical Impression  Pt seen for speech treatment to address dysarthria and discuss compensations for medicine management, etc.  Pt mildly dysarthric but is intelligible.  His spouse reports speech to be near baseline *pt has a baseline lisp* but does become "slurred" when pt is tired later in the day.  Encouraged pt to compensate by over articulating to maximize intelligibility.  He also reports his rate of speech is slower currently, which actually facilitates his intelligibility.      Pt recalled areas of scoring lower on MOCA - including creating "F" words nor sequential subtraction from 100 without cues and wife confirms accuracy of information.  He reports he is near baseline cognitive level.  Wife reports pt will not have SLP on CIR per her written documents.  Defer to CIR SLP and/or family for indication for further tx.    HPI Other Pertinent Information: 74 y.o. male who woke up this AM feeling normal. He drove to his sons house and when he stood up he noted his right leg felt as if it was not working correctly. He noted these symptoms worsened and he was not stable on his feet. EMS was called. On arrival to ED He had dysarthria, right facial droop and right leg weakness.MRI postive for acute infarct.   Pertinent Vitals Pain Assessment: No/denies pain  SLP Plan  Other (Comment) (wife reports pt not going to receive SLP on CIR per forms she signed- defer to CIR SLP for indication)    Recommendations  Defer to CIR              Follow up Recommendations: Inpatient Rehab Plan: Other (Comment) (wife reports pt not going to receive SLP on CIR per forms she signed- defer to CIR SLP for indication)    GO     Donavan Burnet, MS [facility]  SLP [phone]

## 2014-08-12 NOTE — Care Management Important Message (Signed)
Important Message  Patient Details  Name: George Stevens MRN: [id number] Date of Birth: [birth year]   Medicare Important Message Given:  Yes-second notification given    Orson Aloe 08/12/2014, 1:54 PM

## 2014-08-12 NOTE — Care Management Note (Signed)
Case Management Note  Patient Details  Name: George Stevens MRN: [id number] Date of Birth: 03-04-1940  Subjective/Objective:   74 y.o. M admitted with stroke who will be discharged to IP Rehab today.         Action/Plan: No further CM Needs.    Expected Discharge Date:  08/12/14               Expected Discharge Plan:  IP Rehab Facility  In-House Referral:     Discharge planning Services  CM Consult  Post Acute Care Choice:    Choice offered to:     DME Arranged:    DME Agency:     HH Arranged:    HH Agency:     Status of Service:  Completed, signed off  Medicare Important Message Given:  Yes-second notification given Date Medicare IM Given:    Medicare IM give by:    Date Additional Medicare IM Given:    Additional Medicare Important Message give by:     If discussed at Long Length of Stay Meetings, dates discussed:    Additional Comments:  Yvone Neu, RN 08/12/2014, 2:32 PM

## 2014-08-12 NOTE — H&P (View-Only) (Signed)
Physical Medicine and Rehabilitation Admission H&P    Chief Complaint  Patient presents with  . Right sided weakness, slurred speech, facial droop    HPI:  George Stevens is a 74 y.o. Right handed male with history of HTN, DM type 2, who was admitted on 08/09/2014 with right-sided weakness, facial droop and slurred speech.  MRI of the head showed acute nonhemorrhagic perforator infarct on the left favoring anterior chorodial over lateral lenticulostriate distribution and bifrontal gliosis c/w remote cerebral contusions. MRA was negative for stenosis or occlusion. Carotid Dopplers without ICA stenosis. 2D echo with ejection fraction of 65% no wall motion abnormalities with grade 1 diastolic dysfunction.  Patient did receive TPA and was started on Unasyn due to cellulitis from recent cat bite. . Dr. 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 Sleep study recommended on outpatient basis due to concerns of sleep apnea.   Patient with resultant dysarthria as well as right sided weakness, right lean with unsteady gait as well as difficulty with recall, problem solving and processing. CIR recommended by MD and rehab team and patient admitted today.    Review of Systems  HENT: Positive for hearing loss.   Eyes: Negative for blurred vision and double vision.  Respiratory: Negative for cough, shortness of breath and wheezing.   Cardiovascular: Negative for chest pain and palpitations.  Gastrointestinal: Positive for heartburn, diarrhea and constipation. Negative for blood in stool.       Bowel urgency with occasional incontinence.  Lacks sense of taste due to HI.   Genitourinary: Positive for frequency. Negative for dysuria.       H/o hesitancy and urgency  Musculoskeletal: Positive for joint pain (left knee pain). Negative for myalgias and neck pain.  Neurological: Positive for dizziness (with upward gaze at tmes. ),  speech change and focal weakness. Negative for sensory change and headaches.  Psychiatric/Behavioral: Positive for depression (since stroke. ). Negative for memory loss. The patient is nervous/anxious (Has anxiety due to bowel issues. ). The patient does not have insomnia (snores at nights).       Past Medical History  Diagnosis Date  . Hypertension   . Diabetes mellitus without complication   . History of colon polyps   . Hesitancy of micturition   . Concussion with brief LOC     Syncope X 1 in 1996. Another episode with fall and loss of sense of taste as well as brief memory deficits.     Past Surgical History  Procedure Laterality Date  . Cataract extraction    . Colonoscopy w/ polypectomy      Family History  Problem Relation Age of Onset  . Hypertension Mother   . Hypertension Father   . Leukemia Brother   . Diabetes Father   . Emphysema Father     Social History:  Married. Retired Designer, television/film set.  Independent but sedentary. He reports that he smokes Pipe once every couple of months.  He used to smoke cigars in the past. He has never used smokeless tobacco. He reports that he drinks a 6 pack over 2 months. He reports that he does not use illicit drugs.    Allergies: No Known Allergies    Medications Prior to Admission  Medication Sig Dispense Refill  . aspirin 81 MG chewable tablet Chew 81 mg by mouth daily.    Marland Kitchen atorvastatin (LIPITOR) 40 MG tablet Take 40 mg by mouth daily.    Marland Kitchen  carvedilol (COREG) 25 MG tablet Take 25 mg by mouth 2 (two) times daily with a meal.    . insulin detemir (LEVEMIR) 100 UNIT/ML injection Inject 70 Units into the skin at bedtime.    . insulin lispro (HUMALOG) 100 UNIT/ML injection Inject 25 Units into the skin 3 (three) times daily before meals.    Marland Kitchen losartan-hydrochlorothiazide (HYZAAR) 100-25 MG per tablet Take 1 tablet by mouth daily.    . metFORMIN (GLUCOPHAGE) 1000 MG tablet Take 1,000 mg by mouth daily with breakfast.    .  terazosin (HYTRIN) 5 MG capsule Take 5 mg by mouth at bedtime.      Home: Home Living Family/patient expects to be discharged to:: Private residence Living Arrangements: Spouse/significant other Available Help at Discharge: Family Type of Home: House Home Access: Stairs to enter Secretary/administrator of Steps: 3 Entrance Stairs-Rails: Can reach both Home Layout: Multi-level, Bed/bath upstairs Alternate Level Stairs-Number of Steps: 12 Alternate Level Stairs-Rails: Can reach both Bathroom Shower/Tub: Health visitor: Pharmacist, community: Yes Home Equipment: None  Lives With: Spouse   Functional History: Prior Function Level of Independence: Independent  Functional Status:  Mobility: Bed Mobility Overal bed mobility: Needs Assistance Bed Mobility: Rolling, Sidelying to Sit Rolling: Min assist Sidelying to sit: Min assist General bed mobility comments: patient able to roll to EOB with min assist and cues, improved ability to power up to sitting, min assist for stability at EOB. Transfers Overall transfer level: Needs assistance Equipment used: Rolling walker (2 wheeled) Transfers: Sit to/from Stand, [employer] Transfers Sit to Stand: Mod assist, +2 physical assistance Stand pivot transfers: Mod assist General transfer comment: Improved control of RLE for power up but easily fatigued and required cues for setting of RLE upon pivotal transfer. VCs for hand placement with assist for R grip Ambulation/Gait Ambulation/Gait assistance: Mod assist, +2 physical assistance Ambulation Distance (Feet): 16 Feet Assistive device: Rolling walker (2 wheeled) Gait Pattern/deviations: Step-through pattern, Decreased stride length, Ataxic, Decreased dorsiflexion - right, Decreased stance time - right, Decreased step length - right, Decreased weight shift to left, Narrow base of support General Gait Details: patient with poor coordination of giat, decreased ability  to stabilize stride length. Assist required to faciliate weight shift to the left for RLE advancement. Assist for upright posture and to maintain stability with RW. Poor overall functional coordiation, continuous cues for sequencing throughout ambulation, 8 steps x2 Gait velocity: decreased Gait velocity interpretation: <1.8 ft/sec, indicative of risk for recurrent falls    ADL: ADL Overall ADL's : Needs assistance/impaired Eating/Feeding: Set up Grooming: Minimal assistance Upper Body Bathing: Minimal assitance, Sitting Lower Body Bathing: Moderate assistance, Sit to/from stand Upper Body Dressing : Moderate assistance, Sitting Lower Body Dressing: Moderate assistance, Sit to/from stand Toilet Transfer: Moderate assistance Toileting- Clothing Manipulation and Hygiene: Moderate assistance Functional mobility during ADLs: Moderate assistance General ADL Comments: Pt very motivated to participate with ADL. Note poor midline orientation and trunk control during ADL. Motor impersistance during ADL. Initiates use of RUE.   Cognition: Cognition Overall Cognitive Status: Impaired/Different from baseline Arousal/Alertness: Awake/alert Orientation Level: Oriented X4 Attention: Selective Selective Attention: Impaired Selective Attention Impairment: Verbal basic Memory: Impaired Memory Impairment: Retrieval deficit, Decreased recall of new information Awareness: Appears intact Problem Solving: Impaired Problem Solving Impairment: Verbal complex Safety/Judgment: Appears intact Cognition Arousal/Alertness: Awake/alert Behavior During Therapy: WFL for tasks assessed/performed Overall Cognitive Status: Impaired/Different from baseline Area of Impairment: Problem solving Problem Solving: Slow processing, Requires verbal cues   Blood pressure  182/84, pulse 92, temperature 97.9 F (36.6 C), temperature source Oral, resp. rate 20, height 5\' 5"  (1.651 m), weight 93.2 kg (205 lb 7.5 oz), SpO2 98  %. Physical Exam  Nursing note and vitals reviewed. Constitutional: He is oriented to person, place, and time. He appears well-developed.  HENT:  Head: Normocephalic and atraumatic.  Eyes: Conjunctivae are normal. Pupils are equal, round, and reactive to light.  Neck: Normal range of motion. Neck supple. No JVD present. No tracheal deviation present. No thyromegaly present.  Cardiovascular: Normal rate and regular rhythm.   No murmur heard. Respiratory: Effort normal and breath sounds normal. No respiratory distress. He has no wheezes. He has no rales.  GI: Soft. Bowel sounds are normal. He exhibits no distension. There is no tenderness.  Musculoskeletal: Normal range of motion. He exhibits no tenderness.  Right hand with dry scab from prior cat bite. No erythema or tenderness. Tenderness along lateral portion of left patella, mild erythema and warmth is noted there---reproduces pain with palpation and rom at knee.   Neurological: He is alert and oriented to person, place, and time.  Right facial weakness with minimal dysarthria. Follows basic one and two step commands without difficulty. Mild dysarthria.  Minimal right 7. Cognitively appears appropriate. RUE 4-/5 detoid, 4/d biceps, triceps, wrist, fingers with pronator drift. RLE: 3+ HF, 4-KE and 4/5 ADF/APF. Decreased FMC on right remains. Senses pain and light touch although may be somewhat decreased in distal legs/feet. DTR's trace to 1+. No resting tone. No Babinski   Skin: Skin is warm and dry. No rash noted. No erythema.  Psychiatric: He has a normal mood and affect. His behavior is normal.    Results for orders placed or performed during the hospital encounter of 08/09/14 (from the past 48 hour(s))  Glucose, capillary     Status: Abnormal   Collection Time: 08/10/14 12:58 PM  Result Value Ref Range   Glucose-Capillary 221 (H) 65 - 99 mg/dL  Glucose, capillary     Status: Abnormal   Collection Time: 08/10/14  4:37 PM  Result Value  Ref Range   Glucose-Capillary 223 (H) 65 - 99 mg/dL   Comment 1 Notify RN    Comment 2 Document in Chart   Urine rapid drug screen (hosp performed)     Status: None   Collection Time: 08/10/14  9:48 PM  Result Value Ref Range   Opiates NONE DETECTED NONE DETECTED   Cocaine NONE DETECTED NONE DETECTED   Benzodiazepines NONE DETECTED NONE DETECTED   Amphetamines NONE DETECTED NONE DETECTED   Tetrahydrocannabinol NONE DETECTED NONE DETECTED   Barbiturates NONE DETECTED NONE DETECTED    Comment:        DRUG SCREEN FOR MEDICAL PURPOSES ONLY.  IF CONFIRMATION IS NEEDED FOR ANY PURPOSE, NOTIFY LAB WITHIN 5 DAYS.        LOWEST DETECTABLE LIMITS FOR URINE DRUG SCREEN Drug Class       Cutoff (ng/mL) Amphetamine      1000 Barbiturate      200 Benzodiazepine   200 Tricyclics       300 Opiates          300 Cocaine          300 THC              50   Glucose, capillary     Status: Abnormal   Collection Time: 08/10/14  9:58 PM  Result Value Ref Range   Glucose-Capillary 213 (H) 65 - 99  mg/dL   Comment 1 Notify RN    Comment 2 Document in Chart   Glucose, capillary     Status: Abnormal   Collection Time: 08/11/14  6:42 AM  Result Value Ref Range   Glucose-Capillary 208 (H) 65 - 99 mg/dL   Comment 1 Notify RN    Comment 2 Document in Chart   Glucose, capillary     Status: Abnormal   Collection Time: 08/11/14 11:24 AM  Result Value Ref Range   Glucose-Capillary 138 (H) 65 - 99 mg/dL   Comment 1 Notify RN    Comment 2 Document in Chart   Glucose, capillary     Status: None   Collection Time: 08/11/14  3:46 PM  Result Value Ref Range   Glucose-Capillary 65 65 - 99 mg/dL  Glucose, capillary     Status: Abnormal   Collection Time: 08/11/14  4:20 PM  Result Value Ref Range   Glucose-Capillary 121 (H) 65 - 99 mg/dL  Glucose, capillary     Status: Abnormal   Collection Time: 08/11/14  9:18 PM  Result Value Ref Range   Glucose-Capillary 148 (H) 65 - 99 mg/dL   Comment 1 Notify RN     Comment 2 Document in Chart   Glucose, capillary     Status: None   Collection Time: 08/12/14  6:41 AM  Result Value Ref Range   Glucose-Capillary 77 65 - 99 mg/dL   Comment 1 Notify RN    Comment 2 Document in Chart    Mr Brain Wo Contrast  08/10/2014   CLINICAL DATA:  Stroke with dysarthria, right facial droop common right leg weakness. TPA given.  EXAM: MRI HEAD WITHOUT CONTRAST  MRA HEAD WITHOUT CONTRAST  TECHNIQUE: Multiplanar, multiecho pulse sequences of the brain and surrounding structures were obtained without intravenous contrast. Angiographic images of the head were obtained using MRA technique without contrast.  COMPARISON:  None.  FINDINGS: MRI HEAD FINDINGS  Calvarium and upper cervical spine: No focal marrow signal abnormality.  Orbits: Bilateral cataract resection.  Sinuses and Mastoids: Clear. Mastoid and middle ears are clear.  Brain: Restricted diffusion in the posterior left putamen, internal capsule, up to the posterior body of the caudate nucleus. Favor anterior choroidal distribution upper lateral lenticulostriate given the posterior appearance, even though there is sparing along the mesial temporal lobe and involvement of the posterior putamen. No superimposed hemorrhage. No additional infarct.  Mild chronic small vessel disease with ischemic gliosis patchy throughout the bilateral cerebral white matter. There is dense gliosis and encephalomalacia in the inferior and polar right more than left frontal lobes, cortical and subcortical.  No evidence of major vessel occlusion, hydrocephalus, mass lesion.  MRA HEAD FINDINGS  Anterior and left posterior communicating arteries are present. Strong dominance of the left vertebral artery.  No definite major branch occlusion, stenosis, or vascular malformation. A 2 mm outpouching from the left carotid terminus, directed inferiorly and anteriorly could reflect a infundibulum (possibly of an occluded anterior choroidal artery) or aneurysm. The  outpouching is beyond the left posterior communicating artery origin.  IMPRESSION: 1. 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 2. MRA negative for treatable stenosis or occlusion. 3. 2 mm left carotid terminus outpouching which could reflect infundibulum or aneurysm. 4. Bifrontal gliosis, pattern consistent with remote cerebral contusions.   Electronically Signed   By: Marnee Spring M.D.   On: 08/10/2014 12:34   Mr Maxine Glenn Head/brain Wo Cm  08/10/2014  CLINICAL DATA:  Stroke with dysarthria, right facial droop common right leg weakness. TPA given.  EXAM: MRI HEAD WITHOUT CONTRAST  MRA HEAD WITHOUT CONTRAST  TECHNIQUE: Multiplanar, multiecho pulse sequences of the brain and surrounding structures were obtained without intravenous contrast. Angiographic images of the head were obtained using MRA technique without contrast.  COMPARISON:  None.  FINDINGS: MRI HEAD FINDINGS  Calvarium and upper cervical spine: No focal marrow signal abnormality.  Orbits: Bilateral cataract resection.  Sinuses and Mastoids: Clear. Mastoid and middle ears are clear.  Brain: Restricted diffusion in the posterior left putamen, internal capsule, up to the posterior body of the caudate nucleus. Favor anterior choroidal distribution upper lateral lenticulostriate given the posterior appearance, even though there is sparing along the mesial temporal lobe and involvement of the posterior putamen. No superimposed hemorrhage. No additional infarct.  Mild chronic small vessel disease with ischemic gliosis patchy throughout the bilateral cerebral white matter. There is dense gliosis and encephalomalacia in the inferior and polar right more than left frontal lobes, cortical and subcortical.  No evidence of major vessel occlusion, hydrocephalus, mass lesion.  MRA HEAD FINDINGS  Anterior and left posterior communicating arteries are present. Strong dominance of the left  vertebral artery.  No definite major branch occlusion, stenosis, or vascular malformation. A 2 mm outpouching from the left carotid terminus, directed inferiorly and anteriorly could reflect a infundibulum (possibly of an occluded anterior choroidal artery) or aneurysm. The outpouching is beyond the left posterior communicating artery origin.  IMPRESSION: 1. 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 2. MRA negative for treatable stenosis or occlusion. 3. 2 mm left carotid terminus outpouching which could reflect infundibulum or aneurysm. 4. Bifrontal gliosis, pattern consistent with remote cerebral contusions.   Electronically Signed   By: Marnee Spring M.D.   On: 08/10/2014 12:34       Medical Problem List and Plan: 1. Functional deficits secondary to left basal ganglia/corona radiata infarct 2.  DVT Prophylaxis/Anticoagulation: Pharmaceutical: Lovenox--indicated due to immobility 3. Pain Management: N/A.  4. Mood: Team to provide ego support. LCSW to follow for evaluation and support.  5. Neuropsych: This patient is capable of making decisions on his own behalf. 6. Skin/Wound Care: Routine pressure relief measures.  Maintain adequate fluid and nutritional intake.  7. Fluids/Electrolytes/Nutrition: Monitor I/O.  Check lytes in am. Offer supplements prn poor intake.  8. DM type 2: Poorly controlled due to dietary non-compliance. Will get RD to help with diet education.  Will monitor BS with ac/hs CBG checks.  Continue levemir at bedtime with SSI for tighter blood sugar control. Continue to hold metformin due to renal insufficiency CKD. 9. HTN: Monitor BP every 8 hours.  Continue losartan, coreg and hytrin. Titrate medications as needed.  10. Cellulitis due to cat bite: Continue Augmentin--antibiotic Day #4/7---thru monday 11. CKD?: Baseline BUN/Cr- 22/1.58. 12. Dyslipidemia: continue Crestor.  13. Low protein stores:  Will add prosource supplements.  4. Left knee pain: potential gout flare. Will try a couple of doses of colchicine. Check uric acid level   Post Admission Physician Evaluation: 1. Functional deficits secondary  to  left basal ganglia/corona radiata infarct 2. Patient is admitted to receive collaborative, interdisciplinary care between the physiatrist, rehab nursing staff, and therapy team. 3. Patient's level of medical complexity and substantial therapy needs in context of that medical necessity cannot be provided at a lesser intensity of care such as a SNF. 4. Patient has experienced substantial functional loss  from his/her baseline which was documented above under the "Functional History" and "Functional Status" headings.  Judging by the patient's diagnosis, physical exam, and functional history, the patient has potential for functional progress which will result in measurable gains while on inpatient rehab.  These gains will be of substantial and practical use upon discharge  in facilitating mobility and self-care at the household level. 5. Physiatrist will provide 24 hour management of medical needs as well as oversight of the therapy plan/treatment and provide guidance as appropriate regarding the interaction of the two. 6. 24 hour rehab nursing will assist with bladder management, bowel management, safety, skin/wound care, disease management, medication administration, pain management and patient education  and help integrate therapy concepts, techniques,education, etc. 7. PT will assess and treat for/with: Lower extremity strength, range of motion, stamina, balance, functional mobility, safety, adaptive techniques and equipment, NMR, visual-spatial awarenes, stroke education.   Goals are: mod I. 8. OT will assess and treat for/with: ADL's, functional mobility, safety, upper extremity strength, adaptive techniques and equipment, NMR, visual-spatial awareness, stroke education, ego support.   Goals  are: mod I. Therapy may proceed with showering this patient. 9. SLP will assess and treat for/with: n/a.  Goals are: n/a. 10. Case Management and Social Worker will assess and treat for psychological issues and discharge planning. 11. Team conference will be held weekly to assess progress toward goals and to determine barriers to discharge. 12. Patient will receive at least 3 hours of therapy per day at least 5 days per week. 13. ELOS: 7-10 days       14. Prognosis:  excellent     Ranelle Oyster, MD, [facility] Physical Medicine & Rehabilitation 08/12/2014   08/12/2014

## 2014-08-12 NOTE — Discharge Summary (Signed)
Stroke Discharge Summary  Patient ID: George Stevens   MRN: [id number]      DOB: 12/26/1940  Date of Admission: 08/09/2014 Date of Discharge: 08/12/2014  Attending Physician:  Marvel Plan, MD, Stroke MD  Consulting Physician(s):    rehabilitation medicine Dr Riley Kill  Patient's PCP:  No primary care provider on file.  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  Active Problems:   CVA (cerebral infarction) BMI  Body mass index is 34.19 kg/(m^2).  Past Medical History  Diagnosis Date  . Hypertension   . Diabetes mellitus without complication    Past Surgical History  Procedure Laterality Date  . Eye surgery      Medications to be continued on Rehab . amoxicillin-clavulanate  1 tablet Oral Q12H  . atorvastatin  80 mg Oral Daily  . carvedilol  25 mg Oral BID WC  . clopidogrel  75 mg Oral Daily  . insulin aspart  0-15 Units Subcutaneous TID WC  . insulin aspart  25 Units Subcutaneous TID WC  . insulin detemir  70 Units Subcutaneous QHS  . losartan  100 mg Oral Daily  . pantoprazole  40 mg Oral Daily  . terazosin  5 mg Oral QHS    LABORATORY STUDIES CBC    Component Value Date/Time   WBC 8.6 08/09/2014 1013   RBC 4.82 08/09/2014 1013   HGB 14.6 08/09/2014 1023   HCT 43.0 08/09/2014 1023   PLT 248 08/09/2014 1013   MCV 83.6 08/09/2014 1013   MCH 28.4 08/09/2014 1013   MCHC 34.0 08/09/2014 1013   RDW 12.8 08/09/2014 1013   LYMPHSABS 2.0 08/09/2014 1013   MONOABS 0.7 08/09/2014 1013   EOSABS 0.1 08/09/2014 1013   BASOSABS 0.0 08/09/2014 1013   CMP    Component Value Date/Time   NA 140 08/09/2014 1023   K 3.9 08/09/2014 1023   CL 103 08/09/2014 1023   CO2 22 08/09/2014 1013   GLUCOSE 208* 08/09/2014 1023   BUN 24* 08/09/2014 1023   CREATININE 1.40* 08/09/2014 1023   CALCIUM 8.6* 08/09/2014 1013   PROT 6.3* 08/09/2014 1013   ALBUMIN 3.4* 08/09/2014 1013   AST  23 08/09/2014 1013   ALT 21 08/09/2014 1013   ALKPHOS 90 08/09/2014 1013   BILITOT 0.8 08/09/2014 1013   GFRNONAA 41* 08/09/2014 1013   GFRAA 48* 08/09/2014 1013   COAGS Lab Results  Component Value Date   INR 1.12 08/09/2014   Lipid Panel    Component Value Date/Time   CHOL 160 08/10/2014 0238   TRIG 177* 08/10/2014 0238   HDL 34* 08/10/2014 0238   CHOLHDL 4.7 08/10/2014 0238   VLDL 35 08/10/2014 0238   LDLCALC 91 08/10/2014 0238   HgbA1C  Lab Results  Component Value Date   HGBA1C 10.7* 08/10/2014   Cardiac Panel (last 3 results) No results for input(s): CKTOTAL, CKMB, TROPONINI, RELINDX in the last 72 hours. Urinalysis No results found for: COLORURINE, APPEARANCEUR, LABSPEC, PHURINE, GLUCOSEU, HGBUR, BILIRUBINUR, KETONESUR, PROTEINUR, UROBILINOGEN, NITRITE, LEUKOCYTESUR Urine Drug Screen     Component Value Date/Time   LABOPIA NONE DETECTED 08/10/2014 2148   COCAINSCRNUR NONE DETECTED 08/10/2014 2148   LABBENZ NONE DETECTED 08/10/2014 2148   AMPHETMU NONE DETECTED 08/10/2014 2148   THCU NONE DETECTED 08/10/2014 2148   LABBARB NONE DETECTED 08/10/2014 2148    Alcohol Level No results found for: ETH   SIGNIFICANT DIAGNOSTIC STUDIES  Ct Head Wo  Contrast 08/09/2014  IMPRESSION:  No acute intracranial abnormality    Mri and Mra Brain Wo Contrast 08/10/2014  IMPRESSION:  1. 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  2. MRA negative for treatable stenosis or occlusion.  3. 2 mm left carotid terminus outpouching which could reflect infundibulum or aneurysm.  4. Bifrontal gliosis, pattern consistent with remote cerebral contusions.   Carotid Doppler Bilateral: 1-39% ICA stenosis. Vertebral artery flow is antegrade.  2D Echocardiogram  08/10/2014 Study Conclusions - Left ventricle: The cavity size was normal. There was moderate concentric hypertrophy. Systolic function was  normal. The estimated ejection fraction was in the range of 60% to 65%. Wall motion was normal; there were no regional wall motion abnormalities. Doppler parameters are consistent with abnormal left ventricular relaxation (grade 1 diastolic dysfunction). There was no evidence of elevated ventricular filling pressure by Doppler parameters. - Aortic valve: Trileaflet; moderately thickened, mildly calcified leaflets. Valve mobility was restricted. There was mild stenosis. Mean gradient (S): 11 mm Hg. Peak gradient (S): 18 mm Hg. Valve area (VTI): 1.88 cm^2. Valve area (Vmax): 1.49 cm^2. Valve area (Vmean): 1.19 cm^2. - Aortic root: The aortic root was normal in size. - Mitral valve: Calcified annulus. Mildly thickened leaflets . - Left atrium: The atrium was normal in size. - Right ventricle: The cavity size was normal. Wall thickness was normal. Systolic function was normal. - Right atrium: The atrium was normal in size. - Tricuspid valve: Structurally normal valve. There was mild regurgitation. - Pulmonic valve: There was no regurgitation. - Pulmonary arteries: Systolic pressure was within the normal range. - Inferior vena cava: The vessel was normal in size. - Pericardium, extracardiac: There was no pericardial effusion.    HISTORY OF PRESENT ILLNES George Stevens is a 74 y.o. male who woke up this AM feeling normal. He drove to his sons house and when he stood up he noted his right leg felt as if it was not working correctly. He noted these symptoms worsened and he was not stable on his feet. EMS was called. On arrival to ED He had dysarthria, right facial droop and right leg weakness. CT head imaging reviewed and was negative. NIHSS 3.   Date last known well: Date: 08/09/2014 Time last known well: Time: 08:30 tPA Given: Yes, delay in giving tPA secondary to family requesting further time to decide whether or not they wished to proceed with tPA. Discussed  risks including ICH and they expressed understanding.  Modified Rankin: Rankin Score=0  HOSPITAL COURSE [name] is a 74 y.o. male with history of HTN, DM, HLD admitted for right sided weakness and subsequently treated with intravenous TPA.  Stroke: left BG and CR infarct most likely secondary to small vessel disease source  MRI Left BG and CR infarct  MRA negative  Carotid Doppler unremarkable  2D Echo EF 60-65%. No cardiac source of emboli identified.  LDL 91  HgbA1c 10.7  SCDs for VTE prophylaxis  Diet heart healthy/carb modified Room service appropriate?: Yes; Fluid consistency:: Thin   aspirin 81 mg orally every day prior to admission, now on plavix 75mg  daily. Continue plavix on discharge.  Patient counseled to be compliant with his antithrombotic medications  Ongoing aggressive stroke risk factor management  Therapy recommendations: CIR  Disposition: CIR today  Diabetes  HgbA1c 10.7 goal < 7.0  Uncontrolled  Currently on levemir and novolog  CBG monitoring  SSI  DM education  Hypertension  Home meds: Coreg,  losartan, hctz  Permissive hypertension - gradually normalized within 5-7 days.  Currently on coreg  Will start losartan  Patient counseled to be compliant with his blood pressure medications  Hyperlipidemia  Home meds: lipitor 40   Currently on lipitor 40  LDL 91, goal < 70  Increase to lipitor 80  Continue statin at discharge  Other Stroke Risk Factors  Advanced age  Sign of obstructive sleep apnea, need sleep study as OP  Other Active Problems  UDS negative  Renal insufficiency  Other Pertinent History  Need sleep study as OP for OSA  Candidate for Stroke AF trial.   Follow-up with Dr. Roda Shutters in 2 months  DISCHARGE EXAM Blood pressure 159/79, pulse 78, temperature 97.9 F (36.6 C), temperature source Oral, resp. rate 20, height 5\' 5"  (1.651 m), weight 93.2 kg (205 lb 7.5 oz), SpO2 98 %.    General - Well nourished, well developed, in no apparent distress.  Ophthalmologic - Sharp disc margins OU.  Cardiovascular - Regular rate and rhythm with no murmur.  Neck - supple, no carotid bruits  Mental Status -  Level of arousal and orientation to time, place, and person were intact. Language including expression, naming, repetition, comprehension was assessed and found intact. Fund of Knowledge was assessed and was intact.  Cranial Nerves II - XII - II - Visual field intact OU. III, IV, VI - Extraocular movements intact. V - Facial sensation intact bilaterally. VII - Facial movement intact bilaterally, but right nasolabial fold flattening. VIII - Hearing & vestibular intact bilaterally. X - Palate elevates symmetrically. XI - Chin turning & shoulder shrug intact bilaterally. XII - Tongue protrusion intact.  Motor Strength - The patient's strength was normal in all extremities except right hand dexterity difficulty and mildly weak grip and left foot drop and pronator drift was present on the right. Bulk was normal and fasciculations were absent.  Motor Tone - Muscle tone was assessed at the neck and appendages and was normal.  Reflexes - The patient's reflexes were symmetrical in all extremities and he had no pathological reflexes.  Sensory - Light touch, temperature/pinprick were assessed and were symmetrical.   Coordination - The patient had normal movements in the hands and feet with no ataxia or dysmetria. Tremor was absent.  Gait and Station - deferred due to safety concerns.   Discharge Diet  Diet heart healthy/carb modified Room service appropriate?: Yes; Fluid consistency:: Thin liquids   DISCHARGE PLAN  Disposition:  Transfer to [facility] for ongoing PT, OT and ST  clopidogrel 75 mg orally every day for secondary stroke prevention.  Recommend ongoing risk factor control by Primary Care Physician at time of discharge from inpatient  rehabilitation.  Follow-up No primary care provider on file. in 2 weeks following discharge from rehab.  Follow-up with Dr. Marvel Plan [facility] in 2 months.   35 minutes were spent preparing discharge.  Delton See PA-C [facility] Pager [phone] 08/12/2014, 5:28 PM  I, the attending vascular neurologist, have personally obtained a history, examined the patient, evaluated laboratory data, individually viewed imaging studies and agree with radiology interpretations. Together with the NP/PA, we formulated the assessment and plan of care which reflects our mutual decision.  I have made any additions or clarifications directly to the above note and agree with the findings and plan as currently documented.    Marvel Plan, MD PhD Stroke Neurology 08/13/2014 12:56 AM

## 2014-08-12 NOTE — Interval H&P Note (Signed)
George Stevens was admitted today to Inpatient Rehabilitation with the diagnosis of left basal ganglia infarct.  The patient's history has been reviewed, patient examined, and there is no change in status.  Patient continues to be appropriate for intensive inpatient rehabilitation.  I have reviewed the patient's chart and labs.  Questions were answered to the patient's satisfaction. The PAPE has been reviewed and assessment remains appropriate.  George Stevens T 08/12/2014, 7:44 PM

## 2014-08-12 NOTE — Progress Notes (Signed)
Physical Therapy Treatment Patient Details Name: George Stevens MRN: [id number] DOB: 04-17-1940 Today's Date: 08/12/2014    History of Present Illness 74 y.o. male who woke up this AM feeling normal. He drove to his sons house and when he stood up he noted his right leg felt as if it was not working correctly. He noted these symptoms worsened and he was not stable on his feet. EMS was called. On arrival to ED He had dysarthria, right facial droop and right leg weakness.MRI postive for acute infarct.    PT Comments    Patient continues to make steady progress towards PT goals at this time. Improvements in RLE control and focus on gait retraining. Continue to recommend CIR upon acute discharge. Will see as indicated and facilitate plan.  Follow Up Recommendations  CIR;Supervision/Assistance - 24 hour     Equipment Recommendations  Other (comment) (TBD)    Recommendations for Other Services Rehab consult     Precautions / Restrictions Precautions Precautions: Fall Restrictions Weight Bearing Restrictions: No    Mobility  Bed Mobility Overal bed mobility: Needs Assistance Bed Mobility: Rolling;Sidelying to Sit Rolling: Supervision Sidelying to sit: Min assist       General bed mobility comments: VCs for sequence and positioning, min assist to power up to upright with cues for midline upon EOB  Transfers Overall transfer level: Needs assistance Equipment used: Rolling walker (2 wheeled) Transfers: Sit to/from Stand Sit to Stand: Min assist;+2 physical assistance         General transfer comment: Improved control of RLE during sit to stand transfers x3 this session. VCs for hand placement upon coming to standing  Ambulation/Gait Ambulation/Gait assistance: Min assist;Mod assist Ambulation Distance (Feet): 90 Feet (broken down into 3 trials of 30 ft ambulation gait training) Assistive device: Rolling walker (2 wheeled) Gait Pattern/deviations: Step-through  pattern;Decreased stride length;Ataxic;Decreased dorsiflexion - right;Decreased stance time - right;Decreased step length - right;Decreased weight shift to left;Narrow base of support Gait velocity: decreased Gait velocity interpretation: <1.8 ft/sec, indicative of risk for recurrent falls General Gait Details: improvements in RLE control this session, gait training focus on faciliatation of weight shift for advancement of RLE. Increased activity tolerance noted. Improvement proprioceptive response in LEs this session   Stairs            Wheelchair Mobility    Modified Rankin (Stroke Patients Only) Modified Rankin (Stroke Patients Only) Pre-Morbid Rankin Score: No symptoms Modified Rankin: Moderately severe disability     Balance   Sitting-balance support: Feet supported Sitting balance-Leahy Scale: Poor Sitting balance - Comments: patient with posterior lean and difficulty maintaining midline/ R bias     Standing balance-Leahy Scale: Poor Standing balance comment: Heavy reliance on UE support, improved midline posture                    Cognition Arousal/Alertness: Awake/alert Behavior During Therapy: WFL for tasks assessed/performed Overall Cognitive Status: Impaired/Different from baseline Area of Impairment: Problem solving             Problem Solving: Slow processing;Requires verbal cues      Exercises      General Comments        Pertinent Vitals/Pain      Home Living                      Prior Function            PT Goals (current goals can now be found in  the care plan section) Acute Rehab PT Goals Patient Stated Goal: to get better PT Goal Formulation: With patient/family Time For Goal Achievement: 08/24/14 Potential to Achieve Goals: Good Progress towards PT goals: Progressing toward goals    Frequency  Min 4X/week    PT Plan Current plan remains appropriate    Co-evaluation             End of Session  Equipment Utilized During Treatment: Gait belt Activity Tolerance: Patient tolerated treatment well;No increased pain Patient left: in chair;with call bell/phone within reach;with chair alarm set;with family/visitor present     Time: 1610-9604 PT Time Calculation (min) (ACUTE ONLY): 20 min  Charges:  $Gait Training: 8-22 mins                    G CodesFabio Asa 08/31/14, 11:38 AM  Charlotte Crumb, PT DPT  [phone]

## 2014-08-12 NOTE — Progress Notes (Signed)
Rehab admissions - I am following this pt for my partner, Britta Mccreedy and we have received insurance approval for inpatient rehab from [facility]. We received medical clearance from neuro Theodoro Grist, neuro PA) and we will admit pt to CIR today.   I updated Toni Amend, case Warehouse manager, Child psychotherapist. I will meet with pt/family shortly to complete admission paperwork as rehab PA was currently in his room.  Thanks.  Juliann Mule, PT Rehabilitation Admissions Coordinator [phone]

## 2014-08-13 ENCOUNTER — Inpatient Hospital Stay (HOSPITAL_COMMUNITY): Payer: Medicare HMO | Admitting: Physical Therapy

## 2014-08-13 ENCOUNTER — Inpatient Hospital Stay (HOSPITAL_COMMUNITY): Payer: Medicare HMO | Admitting: Occupational Therapy

## 2014-08-13 ENCOUNTER — Inpatient Hospital Stay (HOSPITAL_COMMUNITY): Payer: Medicare HMO

## 2014-08-13 LAB — URIC ACID: URIC ACID, SERUM: 8.2 mg/dL — AB (ref 4.4–7.6)

## 2014-08-13 LAB — CBC WITH DIFFERENTIAL/PLATELET
Basophils Absolute: 0 10*3/uL (ref 0.0–0.1)
Basophils Relative: 1 % (ref 0–1)
Eosinophils Absolute: 0.1 10*3/uL (ref 0.0–0.7)
Eosinophils Relative: 1 % (ref 0–5)
HCT: 38.2 % — ABNORMAL LOW (ref 39.0–52.0)
HEMOGLOBIN: 12.8 g/dL — AB (ref 13.0–17.0)
LYMPHS PCT: 26 % (ref 12–46)
Lymphs Abs: 2.3 10*3/uL (ref 0.7–4.0)
MCH: 27.9 pg (ref 26.0–34.0)
MCHC: 33.5 g/dL (ref 30.0–36.0)
MCV: 83.2 fL (ref 78.0–100.0)
MONO ABS: 0.8 10*3/uL (ref 0.1–1.0)
MONOS PCT: 9 % (ref 3–12)
Neutro Abs: 5.7 10*3/uL (ref 1.7–7.7)
Neutrophils Relative %: 63 % (ref 43–77)
Platelets: 239 10*3/uL (ref 150–400)
RBC: 4.59 MIL/uL (ref 4.22–5.81)
RDW: 13.1 % (ref 11.5–15.5)
WBC: 8.9 10*3/uL (ref 4.0–10.5)

## 2014-08-13 LAB — COMPREHENSIVE METABOLIC PANEL
ALK PHOS: 104 U/L (ref 38–126)
ALT: 16 U/L — ABNORMAL LOW (ref 17–63)
AST: 15 U/L (ref 15–41)
Albumin: 2.9 g/dL — ABNORMAL LOW (ref 3.5–5.0)
Anion gap: 8 (ref 5–15)
BUN: 25 mg/dL — ABNORMAL HIGH (ref 6–20)
CALCIUM: 8.7 mg/dL — AB (ref 8.9–10.3)
CO2: 25 mmol/L (ref 22–32)
Chloride: 105 mmol/L (ref 101–111)
Creatinine, Ser: 1.9 mg/dL — ABNORMAL HIGH (ref 0.61–1.24)
GFR calc Af Amer: 38 mL/min — ABNORMAL LOW (ref >60)
GFR calc non Af Amer: 33 mL/min — ABNORMAL LOW (ref >60)
Glucose, Bld: 263 mg/dL — ABNORMAL HIGH (ref 65–99)
Potassium: 3.8 mmol/L (ref 3.5–5.1)
Sodium: 138 mmol/L (ref 135–145)
Total Bilirubin: 0.7 mg/dL (ref 0.3–1.2)
Total Protein: 6.4 g/dL — ABNORMAL LOW (ref 6.5–8.1)

## 2014-08-13 LAB — GLUCOSE, CAPILLARY
GLUCOSE-CAPILLARY: 205 mg/dL — AB (ref 65–99)
Glucose-Capillary: 233 mg/dL — ABNORMAL HIGH (ref 65–99)
Glucose-Capillary: 231 mg/dL — ABNORMAL HIGH (ref 65–99)
Glucose-Capillary: 261 mg/dL — ABNORMAL HIGH (ref 65–99)

## 2014-08-13 NOTE — Plan of Care (Signed)
Problem: Food- and Nutrition-Related Knowledge Deficit (NB-1.1) Goal: Nutrition education Formal process to instruct or train a patient/client in a skill or to impart knowledge to help patients/clients voluntarily manage or modify food choices and eating behavior to maintain or improve health. Outcome: Adequate for Discharge  RD consulted for nutrition education regarding diabetes. Pt says he is familiar with diabetic diet but is just not compliant by his choice.    Lab Results  Component Value Date    HGBA1C 10.7* 08/10/2014    RD provided  "Plate Method" handout provided. Discussed different food groups and their effects on blood sugar, emphasizing carbohydrate-containing foods. Provided list of carbohydrates and recommended serving sizes of common foods. Encouraged him to limit excess portions of high carbohydrate foods.  Discussed importance of controlled and consistent carbohydrate intake throughout the day. Provided examples of ways to balance meals/snacks and encouraged intake of high-fiber, whole grain complex carbohydrates. Teach back method used.  Expect fair compliance. Pt complains that his wife is not diabetic and cooks for her and the grandkids.   Body mass index is 31.15 kg/(m^2). Pt meets criteria for obesity class I based on current BMI.  Current diet order is CHO Modified, patient is consuming approximately 75% of meals at this time. Labs and medications reviewed. No further nutrition interventions warranted at this time. RD contact information provided. If additional nutrition issues arise, please re-consult RD.  George Shivers MS,RD,CSG,LDN Office: [phone] Pager: [phone]

## 2014-08-13 NOTE — Progress Notes (Signed)
Occupational Therapy Note  Patient Details  Name: George Stevens MRN: [id number] Date of Birth: 06-17-1940  Today's Date: 08/13/2014 OT Individual Time: 1400-1500 OT Individual Time Calculation (min): 60 min   Pt denied pain Individual therapy  Pt resting in w/c upon arrival and agreeable to participating in OT with focus on RUE strength and coordination, RUE FMC, sit<>stand, standing balance, discharge planning, and safety awareness.  Pt issued therapy putty and instructed in use to increase [facility] and dexterity.  Pt also practiced handwriting to increase coordination.  Pt also engaged in RUE therex with 500g weighted ball to increase strength and control at shoulder.  Pt exhibits decreased control with open chain tasks.   Lavone Neri [facility] 08/13/2014, 3:47 PM

## 2014-08-13 NOTE — Evaluation (Signed)
Occupational Therapy Assessment and Plan  Patient Details  Name: George Stevens MRN: [id number] Date of Birth: [birth year]  OT Diagnosis: ataxia, hemiplegia affecting dominant side and muscle weakness (generalized) Rehab Potential: Rehab Potential (ACUTE ONLY): Good ELOS: 14-16 days   Today's Date: 08/13/2014 OT Individual Time: 1100-1200 OT Individual Time Calculation (min): 60 min     Problem List:  Patient Active Problem List   Diagnosis Date Noted  . Right hemiparesis 08/12/2014  . 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 Diabetes type 2, uncontrolled   . Cellulitis of right upper extremity   . Essential hypertension   . Primary gout   . Basal ganglia infarction 08/09/2014    Past Medical History:  Past Medical History  Diagnosis Date  . Hypertension   . Diabetes mellitus without complication   . History of colon polyps   . Hesitancy of micturition   . Concussion with brief LOC     Syncope X 1 in 1996. Another episode with fall and loss of sense of taste as well as brief memory deficits.    Past Surgical History:  Past Surgical History  Procedure Laterality Date  . Cataract extraction    . Colonoscopy w/ polypectomy      Assessment & Plan Clinical Impression:  .George Stevens is a 74 y.o. Right handed male with history of HTN, DM type 2, who was admitted on 08/09/2014 with right-sided weakness, facial droop and slurred speech. MRI of the head showed acute nonhemorrhagic perforator infarct on the left favoring anterior chorodial over lateral lenticulostriate distribution and bifrontal gliosis c/w remote cerebral contusions. MRA was negative for stenosis or occlusion. Carotid Dopplers without ICA stenosis. 2D echo with ejection fraction of 65% no wall motion abnormalities with grade 1 diastolic dysfunction. Patient did receive TPA and was started on Unasyn due to cellulitis from recent cat bite. . Dr. Erlinda Hong recommended Plavix for thrombotic stroke due to small vessel disease as  well as management of stroke risk factors. Sleep study recommended on outpatient basis due to concerns of sleep apnea. Patient with resultant dysarthria as well as right sided weakness, right lean with unsteady gait as well as difficulty with recall, problem solving and processing  Patient transferred to CIR on 08/12/2014 .    Patient currently requires mod with basic self-care skills secondary to ataxia and decreased coordination.  Prior to hospitalization, patient could complete BADL with independent .  Patient will benefit from skilled intervention to increase independence with basic self-care skills and increase level of independence with iADL prior to discharge home with care partner.  Anticipate patient will require intermittent supervision and follow up home health.  OT - End of Session Activity Tolerance: Tolerates 10 - 20 min activity with multiple rests Endurance Deficit: Yes Endurance Deficit Description: not sleeping well OT Assessment Rehab Potential (ACUTE ONLY): Good Barriers to Discharge: Inaccessible home environment OT Plan OT Intensity: Minimum of 1-2 x/day, 45 to 90 minutes OT Frequency: 5 out of 7 days OT Duration/Estimated Length of Stay: 14-16 days OT Treatment/Interventions: Balance/vestibular training;Community reintegration;DME/adaptive equipment instruction;Functional mobility training;Neuromuscular re-education;Patient/family education;Pain management;Self Care/advanced ADL retraining;Therapeutic Activities;Therapeutic Exercise;UE/LE Strength taining/ROM;UE/LE Coordination activities OT Recommendation Patient destination: Home Follow Up Recommendations: Home health OT;Outpatient OT Equipment Recommended: To be determined   Skilled Therapeutic Intervention OT provided education on balance, functional mobility, OT goals, and POC.  Pt. Ambulated with RW to shower stall. Engaged in bathing with min assist for sit to stand.  Had one LOB during standing but pt  demonstrated balance reaction  and min assist from OT to recover.  Pt is Right hand dominant and has decreased coordination in hand and strength in UE.  Pt. Completed session with minimal signs of fatigue.  Remained in [facility] with safety belt on and call bell,phone within reach.   OT Evaluation Precautions/Restrictions  Precautions Precautions: Fall Restrictions Weight Bearing Restrictions: No      Pain:  nonoe   Home Living/Prior Functioning Home Living Available Help at Discharge: Family Type of Home: House Home Access: Stairs to enter [employer] of Steps: 3 step in garage; 12 steps to 2nd floor Entrance Stairs-Rails: Can reach both Home Layout: Multi-level, Bed/bath upstairs Alternate Level Stairs-Number of Steps: garage entrance is 3-4 steps; rails Both side Alternate Level Stairs-Rails: Can reach both Bathroom Shower/Tub: Walk-in shower, Door (regular shower head; on 2nd floor; 1/2bathon 1st floor) Bathroom Toilet: Standard Bathroom Accessibility: Yes  Lives With: Spouse IADL History Homemaking Responsibilities: Yes Meal Prep Responsibility: Secondary Laundry Responsibility: Secondary Cleaning Responsibility: Secondary Bill Paying/Finance Responsibility: Primary Shopping Responsibility: Primary Homemaking Comments: doing simple kitchen, laundry. Current License: Yes Mode of TransportationOccupational psychologist Leisure and Hobbies:  (internet, yard wk, TV- movies,news) Prior Function Level of Independence: Independent with gait, Independent with transfers  Able to Take Stairs?: Yes Driving: Yes Vocation: Retired Wellsite geologist) ADL   Vision/Perception  Vision- History Baseline Vision/History: Wears glasses Wears Glasses: Reading only Patient Visual Report: No change from baseline Vision- Assessment Vision Assessment?: No apparent visual deficits Perception Perception: Within Functional Limits  Cognition Overall Cognitive Status: Within Functional Limits for  tasks assessed Arousal/Alertness: Awake/alert Orientation Level: Person;Place;Situation Month: August Day of Week: Correct Memory: Impaired Memory Impairment: Retrieval deficit;Decreased recall of new information Immediate Memory Recall: Sock;Blue;Bed Memory Recall: Sock;Blue;Bed Memory Recall Sock: Without Cue Memory Recall Blue: With Cue Memory Recall Bed: With Cue Attention: Selective Selective Attention: Impaired Selective Attention Impairment: Verbal basic Awareness: Appears intact Problem Solving: Impaired Problem Solving Impairment: Verbal complex Executive Function: Sequencing;Reasoning Safety/Judgment: Appears intact Sensation Sensation Light Touch: Appears Intact Coordination Gross Motor Movements are Fluid and Coordinated: No Fine Motor Movements are Fluid and Coordinated: No (shoulder flexion 0- 120 ) Coordination and Movement Description: decreased handwriting Motor  Motor Motor: Hemiplegia;Abnormal postural alignment and control Motor - Skilled Clinical Observations: decreased handwriting, and shoulder strength Mobility  Bed Mobility Bed Mobility: Supine to Sit;Sit to Supine Supine to Sit: 5: Supervision;HOB flat Sit to Supine: 5: Supervision;HOB flat Transfers Sit to Stand: 3: Mod assist Stand to Sit: 3: Mod assist  Trunk/Postural Assessment  Cervical Assessment Cervical Assessment: Within Functional Limits Thoracic Assessment Thoracic Assessment: Within Functional Limits Lumbar Assessment Lumbar Assessment: Within Functional Limits Postural Control Postural Control: Deficits on evaluation  Balance Balance Balance Assessed: Yes Static Sitting Balance Static Sitting - Balance Support: Feet supported Static Sitting - Level of Assistance: 5: Stand by assistance Dynamic Sitting Balance Sitting balance - Comments: patient with posterior lean and difficulty maintaining midline/ R bias Static Standing Balance Static Standing - Balance Support: During  functional activity (left knee paine static 1/10; 7/10 with standing ?gout or fal) Static Standing - Level of Assistance: 5: Stand by assistance Dynamic Standing Balance Dynamic Standing - Balance Support: During functional activity Dynamic Standing - Level of Assistance: 2: Max assist Extremity/Trunk Assessment RUE Assessment RUE Assessment: Exceptions to [facility] RUE AROM (degrees) Overall AROM Right Upper Extremity: Deficits (Brunmstom Stage 4) LUE Assessment LUE Assessment: Within Functional Limits  FIM:  FIM - Eating Eating Activity: 5: Set-up assist for open containers FIM - Grooming  Grooming Steps: Wash, rinse, dry face;Wash, rinse, dry hands;Brush, comb hair Grooming: 5: Set-up assist to obtain items FIM - Bathing Bathing Steps Patient Completed: Chest;Right Arm;Left Arm;Abdomen;Front perineal area;Right upper leg;Left upper leg Bathing: 3: Mod-Patient completes 5-7 55f10 parts or 50-74% FIM - Upper Body Dressing/Undressing Upper body dressing/undressing: 0: Wears gown/pajamas-no public clothing FIM - Toileting Toileting steps completed by patient: Adjust clothing after toileting;Adjust clothing prior to toileting Toileting: 0: Activity did not occur FIM - Bed/Chair Transfer Bed/Chair Transfer Assistive Devices: WCopy 5: Supine > Sit: Supervision (verbal cues/safety issues);5: Sit > Supine: Supervision (verbal cues/safety issues);3: Bed > Chair or W/C: Mod A (lift or lower assist);3: Chair or W/C > Bed: Mod A (lift or lower assist) FIM - TAir cabin crewTransfers: 0-Activity did not occur FIM - TSystems developerDevices: WEnvironmental consultantWalk in shower;Grab bars;Shower cClinical biochemistTransfers: 4-Into Tub/Shower: Min A (steadying Pt. > 75%/lift 1 leg);4-Out of Tub/Shower: Min A (steadying Pt. > 75%/lift 1 leg)   Refer to Care Plan for Long Term Goals  Recommendations for other services: None  Discharge Criteria: Patient will be  discharged from OT if patient refuses treatment 3 consecutive times without medical reason, if treatment goals not met, if there is a change in medical status, if patient makes no progress towards goals or if patient is discharged from hospital.  The above assessment, treatment plan, treatment alternatives and goals were discussed and mutually agreed upon: by patient  ELisa Roca8/06/2014, 7:17 PM

## 2014-08-13 NOTE — Progress Notes (Addendum)
Subjective:No complaints. He feels well. Objective:BP 140/64 mmHg  Pulse 63  Temp(Src) 97.7 F (36.5 C) (Oral)  Resp 20  Ht  (1.651 m)  Wt 187 lb 2.7 oz (84.9 kg)  BMI 31.15 kg/m2  SpO2 95% bp- 137/66-191/91  No acute distressChest clear to auscultation HEENT exam: Atraumatic, normocephalic Cardiac exam: S1 and S2 are regular Extremities without edema  Assessment and plan: Medical Problem List and Plan: 1. Functional deficits secondary to left basal ganglia/corona radiata infarct 2. DVT Prophylaxis/Anticoagulation: Pharmaceutical: Lovenox--indicated due to immobility 3. Pain Management: N/A.  4. Mood: Team to provide ego support. LCSW to follow for evaluation and support.  5. Neuropsych: This patient is capable of making decisions on his own behalf. 6. Skin/Wound Care: Routine pressure relief measures. Maintain adequate fluid and nutritional intake.  7. Fluids/Electrolytes/Nutrition: Monitor I/O.  Basic Metabolic Panel:    Component Value Date/Time   NA 138 08/13/2014 0427   K 3.8 08/13/2014 0427   CL 105 08/13/2014 0427   CO2 25 08/13/2014 0427   BUN 25* 08/13/2014 0427   CREATININE 1.90* 08/13/2014 0427   GLUCOSE 263* 08/13/2014 0427   CALCIUM 8.7* 08/13/2014 0427    8. DM type 2: Poorly controlled due to dietary non-compliance. Will get RD to help with diet education. Will monitor BS with ac/hs CBG checks. Continue levemir at bedtime with SSI for tighter blood sugar control. Continue to hold metformin due to renal insufficiency CKD. 9. HTN: Monitor BP every 8 hours. Continue losartan, coreg and hytrin. Titrate medications as needed.  10. Cellulitis due to cat bite: Continue Augmentin--antibiotic Day #4/7---thru monday 11. CKD?: Baseline BUN/Cr- 22/1.58. Creatinine seems to be worsening. He is on an ARB Will push by mouth fluids on 08/13/2014. Check labs on 08/14/2014. 12. Dyslipidemia: continue Crestor.  13. Low protein stores: Will add prosource  supplements.  4. Left knee pain: potential gout flare.  Uric acid= 8.2

## 2014-08-13 NOTE — Evaluation (Signed)
Physical Therapy Assessment and Plan  Patient Details  Name: George Stevens MRN: [id number] Date of Birth: [birth year]  PT Diagnosis: Difficulty walking, Muscle weakness and Pain in joint Rehab Potential: Fair ELOS: 16 to 18 days   Today's Date: 08/13/2014 PT Individual Time: 0800-0915 PT Individual Time Calculation (min): 75 min    Problem List:  Patient Active Problem List   Diagnosis Date Noted  . Right hemiparesis 08/12/2014  . 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 Diabetes type 2, uncontrolled   . Cellulitis of right upper extremity   . Essential hypertension   . Primary gout   . Basal ganglia infarction 08/09/2014    Past Medical History:  Past Medical History  Diagnosis Date  . Hypertension   . Diabetes mellitus without complication   . History of colon polyps   . Hesitancy of micturition   . Concussion with brief LOC     Syncope X 1 in 1996. Another episode with fall and loss of sense of taste as well as brief memory deficits.    Past Surgical History:  Past Surgical History  Procedure Laterality Date  . Cataract extraction    . Colonoscopy w/ polypectomy      Assessment & Plan Clinical Impression: George Stevens is a 74 y.o. Right handed male with history of HTN, DM type 2, who was admitted on 08/09/2014 with right-sided weakness, facial droop and slurred speech. MRI of the head showed acute nonhemorrhagic perforator infarct on the left favoring anterior chorodial over lateral lenticulostriate distribution and bifrontal gliosis c/w remote cerebral contusions. MRA was negative for stenosis or occlusion. Carotid Dopplers without ICA stenosis. 2D echo with ejection fraction of 65% no wall motion abnormalities with grade 1 diastolic dysfunction. Patient did receive TPA and was started on Unasyn due to cellulitis from recent cat bite. . Dr. Erlinda Hong recommended Plavix for thrombotic stroke due to small vessel disease as well as management of stroke risk factors. Sleep study recommended  on outpatient basis due to concerns of sleep apnea. Patient with resultant dysarthria as well as right sided weakness, right lean with unsteady gait as well as difficulty with recall, problem solving and processing.   Patient transferred to CIR on 08/12/2014 .   Patient currently requires mod with mobility secondary to muscle weakness, decreased cardiorespiratoy endurance and decreased awareness, decreased safety awareness and decreased memory.  Prior to hospitalization, patient was independent  with mobility and lived with Spouse in a House home.  Home access is 3 step in garage; 12 steps to 2nd floorStairs to enter.  Patient will benefit from skilled PT intervention to maximize safe functional mobility, minimize fall risk and decrease caregiver burden for planned discharge home with 24 hour supervision.  Anticipate patient will benefit from follow up George Stevens at discharge.  PT - End of Session Activity Tolerance: Tolerates 30+ min activity with multiple rests Endurance Deficit: Yes PT Assessment Rehab Potential (ACUTE/IP ONLY): Fair Barriers to Discharge: Inaccessible home environment;Decreased caregiver support PT Patient demonstrates impairments in the following area(s): Balance;Endurance;Safety;Motor PT Transfers Functional Problem(s): Bed Mobility;Bed to Chair;Car PT Locomotion Functional Problem(s): Stairs;Wheelchair Mobility;Ambulation PT Plan PT Intensity: Minimum of 1-2 x/day ,45 to 90 minutes PT Frequency: 5 out of 7 days PT Duration Estimated Length of Stay: 16 to 18 days PT Treatment/Interventions: Ambulation/gait training;Balance/vestibular training;Discharge planning;DME/adaptive equipment instruction;Functional electrical stimulation;Functional mobility training;Patient/family education;Neuromuscular re-education;Pain management;Psychosocial support;Splinting/orthotics;Therapeutic Exercise;Therapeutic Activities;Stair training;UE/LE Strength taining/ROM;UE/LE Coordination  activities;Wheelchair propulsion/positioning PT Transfers Anticipated Outcome(s): mod I transfers PT Locomotion Anticipated Outcome(s): mod I ambulation, mod I  w/c mobility, S stairs PT Recommendation Recommendations for Other Services: Other (comment) Follow Up Recommendations: Home health PT Patient destination: Home Equipment Recommended: To be determined  Skilled Therapeutic Intervention PT evaluation completed and treatment plan initiated. Pt transferred w/c to edge of bed with mod A and verbal cues. Pt transferred edge of mat to supine with S and increased time. Pt transferred supine to edge of mat with S and increased time. Pt transferred edge of mat to w/c with mod A and verbal cues. Pt performed multiple stands with min to mod A and verbal cues. Pt ambulated with rolling walker and mod A for about 20 feet. Pt has increased difficulty with turns and clearing R foot during swing phase as pt fatigues. Pt returned to room and left sitting up in w/c with quick release belt in place.   PT Evaluation Precautions/Restrictions Precautions Precautions: Fall Restrictions Weight Bearing Restrictions: No General Chart Reviewed: Yes Family/Caregiver Present: No Vital Signs  Pain Pt c/o 7/10 pain L knee.   Home Living/Prior Functioning Home Living Available Help at Discharge: Family Type of Home: House Home Access: Stairs to enter [employer] of Steps: 3 step in garage; 12 steps to 2nd floor Entrance Stairs-Rails: Can reach both Home Layout: Multi-level;1/2 bath on main level;Bed/bath upstairs  Lives With: Spouse Prior Function Level of Independence: Independent with gait;Independent with transfers  Able to Take Stairs?: Yes Driving: Yes Vocation: Retired Vision/Perception  As per OT evaluation.   Cognition Overall Cognitive Status: Within Functional Limits for tasks assessed Arousal/Alertness: Awake/alert Orientation Level: Oriented X4 Memory: Impaired Memory  Impairment: Decreased recall of new information Awareness: Impaired Safety/Judgment: Impaired (pt unaware it would be unsafe for him to attempt to transfer by himself on evaluation) Sensation Sensation Light Touch: Appears Intact Coordination Gross Motor Movements are Fluid and Coordinated: No Fine Motor Movements are Fluid and Coordinated: No Motor  Motor Motor:  (hemiparesis)  Mobility Bed Mobility Bed Mobility: Supine to Sit;Sit to Supine Supine to Sit: 5: Supervision;HOB flat Sit to Supine: 5: Supervision;HOB flat Transfers Transfers: Yes Sit to Stand: 3: Mod assist Stand to Sit: 3: Mod assist Stand Pivot Transfers: 3: Mod assist Locomotion  Ambulation Ambulation: Yes Ambulation/Gait Assistance: 2: Max assist Ambulation Distance (Feet): 4 Feet Assistive device: None Stairs / Additional Locomotion Stairs: Yes Stairs Assistance: 3: Mod assist Stair Management Technique: Two rails Number of Stairs: 1 Height of Stairs: 7 Wheelchair Mobility Wheelchair Mobility: Yes Wheelchair Assistance: 5: Supervision Distance: 125  Trunk/Postural Assessment  Cervical Assessment Cervical Assessment: Within Functional Limits Thoracic Assessment Thoracic Assessment: Within Functional Limits Lumbar Assessment Lumbar Assessment: Within Functional Limits Postural Control Postural Control: Deficits on evaluation  Balance Balance Balance Assessed: Yes Static Sitting Balance Static Sitting - Balance Support: Feet supported Static Sitting - Level of Assistance: 5: Stand by assistance Static Standing Balance Static Standing - Balance Support: During functional activity Static Standing - Level of Assistance: 4: Min assist Dynamic Standing Balance Dynamic Standing - Balance Support: During functional activity Dynamic Standing - Level of Assistance: 2: Max assist Extremity Assessment B UEs as per OT evaluation.   RLE Assessment RLE Assessment: Exceptions to [facility] RLE AROM  (degrees) Overall AROM Right Lower Extremity: Within functional limits for tasks assessed RLE Strength RLE Overall Strength: Deficits RLE Overall Strength Comments: grossly 3-/5 to 3/5 LLE Assessment LLE Assessment: Exceptions to [facility] LLE AROM (degrees) Overall AROM Left Lower Extremity: Due to pain LLE Strength LLE Overall Strength: Due to pain LLE Overall Strength Comments: grossly 3/5  however difficult to formally assess secondary to pain L knee  FIM:  FIM - Bed/Chair Transfer Bed/Chair Transfer Assistive Devices: Copy: 5: Supine > Sit: Supervision (verbal cues/safety issues);5: Sit > Supine: Supervision (verbal cues/safety issues);3: Bed > Chair or W/C: Mod A (lift or lower assist);3: Chair or W/C > Bed: Mod A (lift or lower assist) FIM - Locomotion: Wheelchair Distance: 125 Locomotion: Wheelchair: 2: Travels 50 - 149 ft with supervision, cueing or coaxing FIM - Locomotion: Ambulation Locomotion: Ambulation Assistive Devices: Other (comment) (no assistive device) Ambulation/Gait Assistance: 2: Max assist FIM - Locomotion: Stairs Locomotion: Scientist, physiological: Hand rail - 2 Locomotion: Stairs: 1: Up and Down < 4 stairs with moderate assistance (Pt: 50 - 74%)   Refer to Care Plan for Long Term Goals  Recommendations for other services: None  Discharge Criteria: Patient will be discharged from PT if patient refuses treatment 3 consecutive times without medical reason, if treatment goals not met, if there is a change in medical status, if patient makes no progress towards goals or if patient is discharged from hospital.  The above assessment, treatment plan, treatment alternatives and goals were discussed and mutually agreed upon: by patient  Dub Amis 08/13/2014, 4:08 PM

## 2014-08-14 ENCOUNTER — Inpatient Hospital Stay (HOSPITAL_COMMUNITY): Payer: Medicare HMO | Admitting: Physical Therapy

## 2014-08-14 ENCOUNTER — Inpatient Hospital Stay (HOSPITAL_COMMUNITY): Payer: Medicare HMO | Admitting: Occupational Therapy

## 2014-08-14 LAB — BASIC METABOLIC PANEL
ANION GAP: 10 (ref 5–15)
BUN: 28 mg/dL — AB (ref 6–20)
CALCIUM: 9.1 mg/dL (ref 8.9–10.3)
CO2: 24 mmol/L (ref 22–32)
Chloride: 102 mmol/L (ref 101–111)
Creatinine, Ser: 1.63 mg/dL — ABNORMAL HIGH (ref 0.61–1.24)
GFR calc non Af Amer: 40 mL/min — ABNORMAL LOW (ref >60)
GFR, EST AFRICAN AMERICAN: 46 mL/min — AB (ref >60)
GLUCOSE: 244 mg/dL — AB (ref 65–99)
Potassium: 4 mmol/L (ref 3.5–5.1)
Sodium: 136 mmol/L (ref 135–145)

## 2014-08-14 LAB — GLUCOSE, CAPILLARY
GLUCOSE-CAPILLARY: 215 mg/dL — AB (ref 65–99)
GLUCOSE-CAPILLARY: 225 mg/dL — AB (ref 65–99)
GLUCOSE-CAPILLARY: 303 mg/dL — AB (ref 65–99)
Glucose-Capillary: 217 mg/dL — ABNORMAL HIGH (ref 65–99)
Glucose-Capillary: 223 mg/dL — ABNORMAL HIGH (ref 65–99)

## 2014-08-14 NOTE — Progress Notes (Signed)
Physical Therapy Session Note  Patient Details  Name: George Stevens MRN: [id number] Date of Birth: [birth year]  Today's Date: 08/14/2014 PT Individual Time: 0915-1000 PT Individual Time Calculation (min): 45 min   Short Term Goals: Week 1:  PT Short Term Goal 1 (Week 1): Pt will perform supine to edge of bed. edge of bed to supine with mod I PT Short Term Goal 2 (Week 1): Pt will transfer bed to chair, chair to bed with min A.  PT Short Term Goal 3 (Week 1): Pt will ambulate with rolling walker and min A about 150 feet.  PT Short Term Goal 4 (Week 1): Pt will propel w/c about 150 feet with S.  PT Short Term Goal 5 (Week 1): Pt will ascend/descend 12 stairs with 1 rail and min A.   Skilled Therapeutic Interventions/Progress Updates:  Pt was seen bedside in the am. Pt propelled w/c about 125 feet with S and verbal cues utilizing B UEs. Pt performed multiple sit to stand transfers with rolling walker, min A and verbal cues. Treatment in gym focused on NMR utilizing step ups 3 sets x 10 reps each. Pt ambulated with rolling walker for 92 and 25 feet x 2 with rolling walker and min to mod A. Pt has increased difficulty with clearance during swing phase on R and with turns as fatigue increases. Pt ascended/descended 4 stairs with B rails and mod A. Pt propelled w/c about 125 feet with S and B UEs. Pt performed car transfers with rolling walker and mod A with verbal cues. Pt returned to room and left sitting up in w/c with quick release belt in place and call bell within reach.   Therapy Documentation Precautions:  Precautions Precautions: Fall Restrictions Weight Bearing Restrictions: No General:    Pain: Pt c/o L knee pain 5/10.    Locomotion : Ambulation Ambulation/Gait Assistance: 3: Mod assist   See FIM for current functional status  Therapy/Group: Individual Therapy  Rayford Halsted 08/14/2014, 12:24 PM

## 2014-08-14 NOTE — Progress Notes (Signed)
Occupational Therapy Session Note  Patient Details  Name: Shloma Roggenkamp MRN: [id number] Date of Birth: 06/21/1940  Today's Date: 08/14/2014 OT Individual Time: 1630-1715 OT Individual Time Calculation (min): 45 min    Short Term Goals: Week 1:  OT Short Term Goal 1 (Week 1): Pt.will be mod I with LB bathing using AE prn OT Short Term Goal 2 (Week 1): Pt will be mod I with LB dressing using AE PRN OT Short Term Goal 3 (Week 1): Pt will transfer to toilet with RW at mod I OT Short Term Goal 4 (Week 1): Pt will perform 3/3 toileting items at mod I OT Short Term Goal 5 (Week 1): Pt. will SBA with dynamic standing balance in shower stall  Skilled Therapeutic Interventions/Progress Updates:    Engaged in functional mobility, wc propulsion, RUE NMRE, transfers., standing balance.  Pt. Propelled wc to gym with assistance for set up.  Transferred to mat with mod assist.  Performed dynamic sitting and standing exercises while engaged in RUE AROM and coordination.  Pt had one LOB in standing with pt sitting down on mat in decreased control manner.  Performed PNF patterns with RUE and 1# bar with cues to balance bar in center.  Used ball balance activities with increased ataxia movements in hand.  Provided exercise in room for RUE with balancing notebook in palm of hand.  Pr.has urine urgency and had to use urinal during session.  He propelled wc back to room and wife, Drenda Freeze present.  Left pt in wc with safety belt on and call bell,phone within reach.     Therapy Documentation Precautions:  Precautions Precautions: Fall Restrictions Weight Bearing Restrictions: No      Pain:   Left knee pain 4/10        See FIM for current functional status  Therapy/Group: Individual Therapy  Humberto Seals 08/14/2014, 5:41 PM

## 2014-08-14 NOTE — Progress Notes (Signed)
Subjective: C/o pain in the L knee at times.  No other complaints. He feels well.   Objective:BP 143/54 mmHg  Pulse 74  Temp(Src) 98.4 F (36.9 C) (Oral)  Resp 18  Ht  (1.651 m)  Wt 187 lb 2.7 oz (84.9 kg)  BMI 31.15 kg/m2  SpO2 98% bp- 137/66-191/91  No acute distress Heart RRR Chest clear to auscultation HEENT exam: Atraumatic, normocephalic Cardiac exam: S1 and S2 are regular Extremities without edema L knee NT, w/o swelling   Medical Problem List and Plan: 1. Functional deficits secondary to left basal ganglia/corona radiata infarct 2. DVT Prophylaxis/Anticoagulation: Pharmaceutical: Lovenox--indicated due to immobility 3. Pain Management: N/A.  4. Mood: Team to provide ego support. LCSW to follow for evaluation and support.  5. Neuropsych: This patient is capable of making decisions on his own behalf. 6. Skin/Wound Care: Routine pressure relief measures. Maintain adequate fluid and nutritional intake.  7. Fluids/Electrolytes/Nutrition: Monitor I/O.   Basic Metabolic Panel:    Component Value Date/Time   NA 136 08/14/2014 0828   K 4.0 08/14/2014 0828   CL 102 08/14/2014 0828   CO2 24 08/14/2014 0828   BUN 28* 08/14/2014 0828   CREATININE 1.63* 08/14/2014 0828   GLUCOSE 244* 08/14/2014 0828   CALCIUM 9.1 08/14/2014 0828    8. DM type 2: Poorly controlled due to dietary non-compliance. Will get RD to help with diet education. Will monitor BS with ac/hs CBG checks. Continue levemir at bedtime with SSI for tighter blood sugar control. Continue to hold metformin due to renal insufficiency CKD. 9. HTN: Monitor BP every 8 hours. Continue losartan, coreg and hytrin. Titrate medications as needed.  10. Cellulitis due to cat bite: Continue Augmentin--antibiotic Day #4/7---thru monday 11. CKD?: Baseline BUN/Cr- 22/1.58. Creatinine seems to be worsening. He is on an ARB Will push by mouth fluids on 08/13/2014. Check labs on 08/14/2014. 12. Dyslipidemia:  continue Crestor.  13. Low protein stores: Will add prosource supplements.  4. Left knee pain: potential gout flare. On Colchicine Uric acid= 8.2

## 2014-08-15 ENCOUNTER — Inpatient Hospital Stay (HOSPITAL_COMMUNITY): Payer: Medicare HMO | Admitting: Speech Pathology

## 2014-08-15 ENCOUNTER — Inpatient Hospital Stay (HOSPITAL_COMMUNITY): Payer: Medicare HMO | Admitting: Physical Therapy

## 2014-08-15 ENCOUNTER — Inpatient Hospital Stay (HOSPITAL_COMMUNITY): Payer: Medicare HMO | Admitting: Occupational Therapy

## 2014-08-15 DIAGNOSIS — M25562 Pain in left knee: Secondary | ICD-10-CM

## 2014-08-15 LAB — GLUCOSE, CAPILLARY
GLUCOSE-CAPILLARY: 120 mg/dL — AB (ref 65–99)
GLUCOSE-CAPILLARY: 213 mg/dL — AB (ref 65–99)
GLUCOSE-CAPILLARY: 328 mg/dL — AB (ref 65–99)
GLUCOSE-CAPILLARY: 250 mg/dL — AB (ref 65–99)

## 2014-08-15 NOTE — Evaluation (Signed)
Speech Language Pathology Assessment and Plan  Patient Details  Name: George Stevens MRN: [id number] Date of Birth: [birth year]  SLP Diagnosis:  (n/a)  Rehab Potential:  (defer to OT/PT) ELOS:  (defer to OT/PT)   Today's Date: 08/15/2014 SLP Individual Time: 1400-1430 SLP Individual Time Calculation (min): 30 min   Problem List:  Patient Active Problem List   Diagnosis Date Noted  . Right hemiparesis 08/12/2014  . 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 Diabetes type 2, uncontrolled   . Cellulitis of right upper extremity   . Essential hypertension   . Primary gout   . Basal ganglia infarction 08/09/2014   Past Medical History:  Past Medical History  Diagnosis Date  . Hypertension   . Diabetes mellitus without complication   . History of colon polyps   . Hesitancy of micturition   . Concussion with brief LOC     Syncope X 1 in 1996. Another episode with fall and loss of sense of taste as well as brief memory deficits.    Past Surgical History:  Past Surgical History  Procedure Laterality Date  . Cataract extraction    . Colonoscopy w/ polypectomy      Assessment / Plan / Recommendation Clinical Impression George Stevens is a 73 y.o.right handed male with history of hypertension, diabetes mellitus with peripheral neuropathy. Prior to admission patient lived with spouse and was independent. Presented 08/09/2014 with right-sided weakness, facial droop and slurred speech. Cranial CT scan negative. 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 Patient did receive TPA. Neurology consulted maintained on Plavix for CVA prophylaxis. Maintain on a regular consistency diet. Physical therapy evaluation completed 08/10/2014 with recommendations of physical medicine rehabilitation consult and patient admitted 080/5/16.  Orders received for cognitive-linguistic evaluation, which was  completed.  Patient demonstrates trace oral weakness, that does not impact his intelligibility. Patient reports some mild memory changes PTA and during eval was Mod I for recall of new information regarding current medications and overall management.  Given that patient is overall Mod I (increased time) for complex problem solving and recall and is at baseline for speech intelligibility no further skilled SLP services are warranted at this time.      Skilled Therapeutic Interventions          Cognitive-linguistic evaluation completed with results and recommendations reviewed with patient, who confirms that he is at baseline/Mod I with recall of new information and complex problem solving.  Therefore, no skilled SLP services are warranted at this time and SLP is signing off.    SLP Assessment  Patient does not need any further Speech [place] Pathology Services    Recommendations  Patient destination: Home Follow up Recommendations: None Equipment Recommended: None recommended by SLP               Pain Pain Assessment Pain Assessment: No/denies pain  Prior Functioning Cognitive/Linguistic Baseline: Within functional limits Type of Home: House  Lives With: Spouse Available Help at Discharge: Family Vocation: Retired  See FIM for current functional status  Recommendations for other services: None  Discharge Criteria: Patient will be discharged from SLP if patient refuses treatment 3 consecutive times without medical reason, if treatment goals not met, if there is a change in medical status, if patient makes no progress towards goals or if patient is discharged from hospital.  The above assessment, treatment plan, treatment alternatives and goals were discussed and mutually agreed upon: by patient  George Stevens, M.A., CCC-SLP [phone]  [place] 08/15/2014, 3:57 PM

## 2014-08-15 NOTE — Care Management Note (Signed)
[facility] Individual Statement of Services  Patient Name:  George Stevens  Date:  08/15/2014  Welcome to the [facility].  Our goal is to provide you with an individualized program based on your diagnosis and situation, designed to meet your specific needs.  With this comprehensive rehabilitation program, you will be expected to participate in at least 3 hours of rehabilitation therapies Monday-Friday, with modified therapy programming on the weekends.  Your rehabilitation program will include the following services:  Physical Therapy (PT), Occupational Therapy (OT), Speech Therapy (ST), 24 hour per day rehabilitation nursing, Therapeutic Recreaction (TR), Case Management (Social Worker), Rehabilitation Medicine, Nutrition Services and Pharmacy Services  Weekly team conferences will be held on Wednesday to discuss your progress.  Your Social Worker will talk with you frequently to get your input and to update you on team discussions.  Team conferences with you and your family in attendance may also be held.  Expected length of stay: 14-16 days  Overall anticipated outcome: mod/i level  Depending on your progress and recovery, your program may change. Your Social Worker will coordinate services and will keep you informed of any changes. Your Social Worker's name and contact numbers are listed  below.  The following services may also be recommended but are not provided by the [facility]:   Driving Evaluations  Home Health Rehabiltiation Services  Outpatient Rehabilitation Services    Arrangements will be made to provide these services after discharge if needed.  Arrangements include referral to agencies that provide these services.  Your insurance has been verified to be:  [employer] Your primary doctor is:  Derek Jack  Pertinent information will be shared with your doctor and your insurance company.  Social Worker:   Dossie Der, SW [phone] or [phone]  Information discussed with and copy given to patient by: Lucy Chris, 08/15/2014, 11:15 AM

## 2014-08-15 NOTE — Progress Notes (Signed)
Physical Therapy Session Note  Patient Details  Name: George Stevens MRN: [id number] Date of Birth: [birth year]  Today's Date: 08/15/2014  PT Individual Time Session 1: [phone] PT Individual Time Calculation Session 1 (min): 60 min  PT Individual Time Session 2: 1130-1200 PT Individual Time Calculation Session 2 (min): 30 min   Short Term Goals: Week 1:  PT Short Term Goal 1 (Week 1): Pt will perform supine to edge of bed. edge of bed to supine with mod I PT Short Term Goal 2 (Week 1): Pt will transfer bed to chair, chair to bed with min A.  PT Short Term Goal 3 (Week 1): Pt will ambulate with rolling walker and min A about 150 feet.  PT Short Term Goal 4 (Week 1): Pt will propel w/c about 150 feet with S.  PT Short Term Goal 5 (Week 1): Pt will ascend/descend 12 stairs with 1 rail and min A.   Skilled Therapeutic Interventions/Progress Updates:  Session 1:   NMR: NuStep x10 minutes at 2 resistance to facilitate UE/LE coordination and improve endurance. Sit>stand from mat with pt facilitating R quad contraction 6 reps x4 sets reaching in D1/D2 UE pattern for clothespins on mirror. Sit>squat from chair x10 with cues for forward weight shift and pushing through knees with UEs.   Gait: 150' with RW and min A for balance. Pt demonstrates poor attention to RLE with decreased foot clearance and step length, which increases with fatigue. Pt required frequent verbal cues to take bigger steps and pay attention to RLE during turns to L.  TUG: 24.89 seconds  Session 2: Therapeutic Activity: Pt propels w/c 150' x2 with supervision. PT instructs patient in step ups 2x10 with R foot and L foot on 4" step. Pt demos decreased foot clearance on right which increases with fatigue and requires verbal cues to exaggerate movement.  Gait Training: Pt negotiated 4 steps x2 with bilat HR and min A. Forward ascent/descent. Step to pattern. Verbal cues for sequencing.   Therapy Documentation Precautions:   Precautions Precautions: Fall Restrictions Weight Bearing Restrictions: No  Pain: Pain Assessment Pain Assessment: 0-10 Pain Score: 3  Pain Location: Knee Pain Orientation: Left   See FIM for current functional status  Therapy/Group: Individual Therapy  Asmaa Tirpak E Penven-Crew 08/15/2014, 12:09 PM

## 2014-08-15 NOTE — IPOC Note (Addendum)
Overall Plan of Care [facility]) Patient Details Name: George Stevens MRN: [id number] DOB: 09/14/1940  Admitting Diagnosis: L CVA  Hospital Problems: Active Problems:   Basal ganglia infarction   Right hemiparesis   Diabetes type 2, uncontrolled   Cellulitis of right upper extremity   Essential hypertension   Primary gout     Functional Problem List: Nursing Bladder, Safety, Medication Management, Endurance, Pain  PT Balance, Endurance, Safety, Motor  OT Endurance, Balance  SLP    TR Activity tolerance, functional mobility, balance, safety, pain,        Basic ADL's: OT Grooming, Bathing, Dressing, Toileting     Advanced  ADL's: OT       Transfers: PT Bed Mobility, Bed to Chair, Customer service manager, Tub/Shower     Locomotion: PT Stairs, Psychologist, prison and probation services, Ambulation     Additional Impairments: OT    SLP        TR      Anticipated Outcomes Item Anticipated Outcome  Self Feeding independent  Swallowing      Basic self-care  mod I  Toileting  mod I   Bathroom Transfers Mod I  Bowel/Bladder  Continent with timed toileting, decreased urgency.  Transfers  mod I transfers  Locomotion  mod I ambulation, mod I w/c mobility, S stairs  Communication     Cognition     Pain  Managed at goal, 2/10  Safety/Judgment  Increased safety awareness, anticipates needs   Therapy Plan: PT Intensity: Minimum of 1-2 x/day ,45 to 90 minutes PT Frequency: 5 out of 7 days PT Duration Estimated Length of Stay: 16 to 18 days OT Intensity: Minimum of 1-2 x/day, 45 to 90 minutes OT Frequency: 5 out of 7 days OT Duration/Estimated Length of Stay: 14-16 days  TR Duration/ELOS:  2 weeks TR Frequency:  Min 1 time per week >20 minutes           Team Interventions: Nursing Interventions Patient/Family Education, Bladder Management, Medication Management, Disease Management/Prevention, Psychosocial Support  PT interventions Ambulation/gait training, Designer, jewellery, Discharge planning, DME/adaptive equipment instruction, Functional electrical stimulation, Functional mobility training, Patient/family education, Neuromuscular re-education, Pain management, Psychosocial support, Splinting/orthotics, Therapeutic Exercise, Therapeutic Activities, Stair training, UE/LE Strength taining/ROM, UE/LE Coordination activities, Wheelchair propulsion/positioning  OT Interventions Warden/ranger, Firefighter, Fish farm manager, Functional mobility training, Neuromuscular re-education, Patient/family education, Pain management, Self Care/advanced ADL retraining, Therapeutic Activities, Therapeutic Exercise, UE/LE Strength taining/ROM, UE/LE Coordination activities  SLP Interventions    TR Interventions   Recreation/leisure participation, Balance/Vestibular training, functional mobility, therapeutic activities, UE/LE strength/coordination, w/c mobility, community reintegration, pt/family education, adaptive equipment instruction/use, discharge planning, psychosocial support   SW/CM Interventions Discharge Planning, Facilities manager, Patient/Family Education    Team Discharge Planning: Destination: PT-Home ,OT- Home , SLP-Home Projected Follow-up: PT-Home health PT, OT-  Home health OT, Outpatient OT, SLP-None Projected Equipment Needs: PT-To be determined, OT- To be determined, SLP-None recommended by SLP Equipment Details: PT- , OT-  Patient/family involved in discharge planning: PT- Patient,  OT-Patient, Family member/caregiver, SLP-Patient  MD ELOS: 7-10d Medical Rehab Prognosis:  Good Assessment: 74 y.o. Right handed male with history of HTN, DM type 2, who was admitted on 08/09/2014 with right-sided weakness, facial droop and slurred speech. MRI of the head showed acute nonhemorrhagic perforator infarct on the left favoring anterior chorodial over lateral lenticulostriate distribution and bifrontal gliosis c/w remote  cerebral contusions. MRA was negative for stenosis or occlusion. Carotid Dopplers without ICA stenosis. 2D echo with ejection fraction of 65%  no wall motion abnormalities with grade 1 diastolic dysfunction. Patient did receive TPA and was started on Unasyn due to cellulitis from recent cat bite.     Now requiring 24/7 Rehab RN,MD, as well as CIR level PT, OT and SLP.  Treatment team will focus on ADLs and mobility with goals set at Mod I  See Team Conference Notes for weekly updates to the plan of care

## 2014-08-15 NOTE — Progress Notes (Signed)
Social Work Assessment and Plan Social Work Assessment and Plan  Patient Details  Name: George Stevens MRN: [id number] Date of Birth: 01-26-1940  Today's Date: 08/15/2014  Problem List:  Patient Active Problem List   Diagnosis Date Noted  . Right hemiparesis 08/12/2014  . 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 Diabetes type 2, uncontrolled   . Cellulitis of right upper extremity   . Essential hypertension   . Primary gout   . Basal ganglia infarction 08/09/2014   Past Medical History:  Past Medical History  Diagnosis Date  . Hypertension   . Diabetes mellitus without complication   . History of colon polyps   . Hesitancy of micturition   . Concussion with brief LOC     Syncope X 1 in 1996. Another episode with fall and loss of sense of taste as well as brief memory deficits.    Past Surgical History:  Past Surgical History  Procedure Laterality Date  . Cataract extraction    . Colonoscopy w/ polypectomy     Social History:  reports that he has been smoking Pipe.  He has never used smokeless tobacco. He reports that he drinks alcohol. He reports that he does not use illicit drugs.  Family / Support Systems Marital Status: Married Patient Roles: Spouse, Parent Spouse/Significant Other: George Stevens [phone]  [phone] Children: George Stevens-daughter  [phone]-cell Other Supports: Two other children-one is local the other is in [place] Anticipated Caregiver: Wife Ability/Limitations of Caregiver: Wife is in good health and can assist pt Caregiver Availability: 24/7 Family Dynamics: Close knit family who rely upon one another, they have two local children and one out of state. Wife's extended fmaily is all local and provides emotional support.  Social History Preferred language: English Religion:  Cultural Background: No issues Education: Some college Read: Yes Write: Yes Employment Status: Retired Fish farm manager Issues: No issues Guardian/Conservator:  Noe-according to MD pt is capable of making his own decisions while here   Abuse/Neglect Physical Abuse: Denies Verbal Abuse: Denies Sexual Abuse: Denies Exploitation of patient/patient's resources: Denies Self-Neglect: Denies  Emotional Status Pt's affect, behavior adn adjustment status: Pt is motivated to regain his independence and get back to his life. His son is coming to see him this weekend and he would like to be home by then. Unsure if can accommodate him on this. He has always been independent and wants to get back to this. Recent Psychosocial Issues: Other helaht issues was managing them, understands now may need to change some of his lifestyle habits Pyschiatric History: No issues deferred depression screen due to doing well and felt it is not necessary at this time. Will monitor while here and intervene if needed. Substance Abuse History: Smokes pipe occasssionally may need to quit this  Patient / Family Perceptions, Expectations & Goals Pt/Family understanding of illness & functional limitations: Pt and wife are able to explain his stroke and deficts as a result. They have spoken with MD and feel their questions are being answered.  They are encouraged by the progress he has made already and are hopeful he will continue to make more while here. Premorbid pt/family roles/activities: Husband, father, grandfather, retiree, home owner, chruch member, etc Anticipated changes in roles/activities/participation: resume Pt/family expectations/goals: Pt states:  " I want to be able to take care of myself befire I leave which I would like to be soon."  Wife states: " I am so impressed how well he is doing already."  [employer]: None Premorbid Home Care/DME  Agencies: None Transportation available at discharge: Family Resource referrals recommended: Support group (specify)  Discharge Planning Living Arrangements: Spouse/significant other Support Systems:  Spouse/significant other, Children, Other relatives, Friends/neighbors, Psychologist, clinical community Type of Residence: Private residence Insurance Resources: Media planner (specify) Administrator Medicare) Financial Resources: Restaurant manager, fast food Screen Referred: No Living Expenses: Own Money Management: Spouse, Patient Does the patient have any problems obtaining your medications?: No Home Management: Wife pt does outside work Associate Professor Plans: Return home with wife who can provide assistance if needed. She plans to be here and observe him in therapies to be able to see his progress. Pt would like to go home by this weekend since his son is coming from [place]. Will await team's evaluations and work on discharge plans. Social Work Anticipated Follow Up Needs: HH/OP, Support Group  Clinical Impression Pleasant gentleman who is motivated to recover from this stroke. He and wife are encouraged by the progress he has already made since his stroke. His wife plans to come in and attend therapies with Pt to see his progress. Will await team conference Wed and work on a safe discharge plan. Wife can provide assist if necessary. Unsure if can accommodate his wish to be home by this weekend.  Lucy Chris 08/15/2014, 2:17 PM

## 2014-08-15 NOTE — Progress Notes (Signed)
74 y.o. Right handed male with history of HTN, DM type 2, who was admitted on 08/09/2014 with right-sided weakness, facial droop and slurred speech. MRI of the head showed acute nonhemorrhagic perforator infarct on the left favoring anterior chorodial over lateral lenticulostriate distribution and bifrontal gliosis c/w remote cerebral contusions. MRA was negative for stenosis or occlusion. Carotid Dopplers without ICA stenosis. 2D echo with ejection fraction of 65% no wall motion abnormalities with grade 1 diastolic dysfunction. Patient did receive TPA  Subjective/Complaints:  Pt without knee pain at night now, still stiff in therapy He is not convinced it was gout   ROS- neg CP, SOB, Abd pain  Objective: Vital Signs: Blood pressure 165/69, pulse 65, temperature 97.7 F (36.5 C), temperature source Oral, resp. rate 17, height 5' 5"  (1.651 m), weight 84.9 kg (187 lb 2.7 oz), SpO2 91 %. No results found. Results for orders placed or performed during the hospital encounter of 08/12/14 (from the past 72 hour(s))  Glucose, capillary     Status: Abnormal   Collection Time: 08/12/14  4:46 PM  Result Value Ref Range   Glucose-Capillary 234 (H) 65 - 99 mg/dL  Glucose, capillary     Status: Abnormal   Collection Time: 08/12/14  9:13 PM  Result Value Ref Range   Glucose-Capillary 307 (H) 65 - 99 mg/dL  CBC WITH DIFFERENTIAL     Status: Abnormal   Collection Time: 08/13/14  4:27 AM  Result Value Ref Range   WBC 8.9 4.0 - 10.5 K/uL   RBC 4.59 4.22 - 5.81 MIL/uL   Hemoglobin 12.8 (L) 13.0 - 17.0 g/dL   HCT 38.2 (L) 39.0 - 52.0 %   MCV 83.2 78.0 - 100.0 fL   MCH 27.9 26.0 - 34.0 pg   MCHC 33.5 30.0 - 36.0 g/dL   RDW 13.1 11.5 - 15.5 %   Platelets 239 150 - 400 K/uL   Neutrophils Relative % 63 43 - 77 %   Neutro Abs 5.7 1.7 - 7.7 K/uL   Lymphocytes Relative 26 12 - 46 %   Lymphs Abs 2.3 0.7 - 4.0 K/uL   Monocytes Relative 9 3 - 12 %   Monocytes Absolute 0.8 0.1 - 1.0 K/uL   Eosinophils  Relative 1 0 - 5 %   Eosinophils Absolute 0.1 0.0 - 0.7 K/uL   Basophils Relative 1 0 - 1 %   Basophils Absolute 0.0 0.0 - 0.1 K/uL  Comprehensive metabolic panel     Status: Abnormal   Collection Time: 08/13/14  4:27 AM  Result Value Ref Range   Sodium 138 135 - 145 mmol/L   Potassium 3.8 3.5 - 5.1 mmol/L   Chloride 105 101 - 111 mmol/L   CO2 25 22 - 32 mmol/L   Glucose, Bld 263 (H) 65 - 99 mg/dL   BUN 25 (H) 6 - 20 mg/dL   Creatinine, Ser 1.90 (H) 0.61 - 1.24 mg/dL   Calcium 8.7 (L) 8.9 - 10.3 mg/dL   Total Protein 6.4 (L) 6.5 - 8.1 g/dL   Albumin 2.9 (L) 3.5 - 5.0 g/dL   AST 15 15 - 41 U/L   ALT 16 (L) 17 - 63 U/L   Alkaline Phosphatase 104 38 - 126 U/L   Total Bilirubin 0.7 0.3 - 1.2 mg/dL   GFR calc non Af Amer 33 (L) >60 mL/min   GFR calc Af Amer 38 (L) >60 mL/min    Comment: (NOTE) The eGFR has been calculated using the CKD EPI  equation. This calculation has not been validated in all clinical situations. eGFR's persistently <60 mL/min signify possible Chronic Kidney Disease.    Anion gap 8 5 - 15  Uric acid     Status: Abnormal   Collection Time: 08/13/14  4:27 AM  Result Value Ref Range   Uric Acid, Serum 8.2 (H) 4.4 - 7.6 mg/dL  Glucose, capillary     Status: Abnormal   Collection Time: 08/13/14  7:06 AM  Result Value Ref Range   Glucose-Capillary 205 (H) 65 - 99 mg/dL  Glucose, capillary     Status: Abnormal   Collection Time: 08/13/14 11:24 AM  Result Value Ref Range   Glucose-Capillary 233 (H) 65 - 99 mg/dL  Glucose, capillary     Status: Abnormal   Collection Time: 08/13/14  4:30 PM  Result Value Ref Range   Glucose-Capillary 231 (H) 65 - 99 mg/dL   Comment 1 Notify RN   Glucose, capillary     Status: Abnormal   Collection Time: 08/13/14  9:03 PM  Result Value Ref Range   Glucose-Capillary 261 (H) 65 - 99 mg/dL  Glucose, capillary     Status: Abnormal   Collection Time: 08/14/14  7:13 AM  Result Value Ref Range   Glucose-Capillary 215 (H) 65 - 99  mg/dL  Basic metabolic panel     Status: Abnormal   Collection Time: 08/14/14  8:28 AM  Result Value Ref Range   Sodium 136 135 - 145 mmol/L   Potassium 4.0 3.5 - 5.1 mmol/L   Chloride 102 101 - 111 mmol/L   CO2 24 22 - 32 mmol/L   Glucose, Bld 244 (H) 65 - 99 mg/dL   BUN 28 (H) 6 - 20 mg/dL   Creatinine, Ser 1.63 (H) 0.61 - 1.24 mg/dL   Calcium 9.1 8.9 - 10.3 mg/dL   GFR calc non Af Amer 40 (L) >60 mL/min   GFR calc Af Amer 46 (L) >60 mL/min    Comment: (NOTE) The eGFR has been calculated using the CKD EPI equation. This calculation has not been validated in all clinical situations. eGFR's persistently <60 mL/min signify possible Chronic Kidney Disease.    Anion gap 10 5 - 15  Glucose, capillary     Status: Abnormal   Collection Time: 08/14/14 11:37 AM  Result Value Ref Range   Glucose-Capillary 225 (H) 65 - 99 mg/dL   Comment 1 Notify RN   Glucose, capillary     Status: Abnormal   Collection Time: 08/14/14  2:02 PM  Result Value Ref Range   Glucose-Capillary 217 (H) 65 - 99 mg/dL   Comment 1 Document in Chart   Glucose, capillary     Status: Abnormal   Collection Time: 08/14/14  5:41 PM  Result Value Ref Range   Glucose-Capillary 303 (H) 65 - 99 mg/dL   Comment 1 Notify RN   Glucose, capillary     Status: Abnormal   Collection Time: 08/14/14  8:48 PM  Result Value Ref Range   Glucose-Capillary 223 (H) 65 - 99 mg/dL   Comment 1 Notify RN   Glucose, capillary     Status: Abnormal   Collection Time: 08/15/14  7:07 AM  Result Value Ref Range   Glucose-Capillary 120 (H) 65 - 99 mg/dL   Comment 1 Notify RN      HEENT: normal Cardio: RRR and no murmur Resp: CTA B/L and unlabored GI: BS positive and NT, ND Extremity:  Pulses positive and No Edema Skin:  Intact Neuro: Alert/Oriented, Normal Sensory, Abnormal Motor 4/5 on RIght side , 5/5 on Left and Abnormal FMC Ataxic/ dec FMC Musc/Skel:  Other minimal Left lateral knee tenderness Gen NAD   Assessment/Plan: 1.  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 Physiatrist is providing close team supervision and 24 hour management of active medical problems listed below. Physiatrist and rehab team continue to assess barriers to discharge/monitor patient progress toward functional and medical goals. FIM: FIM - Bathing Bathing Steps Patient Completed: Chest, Right Arm, Left Arm, Abdomen, Front perineal area, Right upper leg, Left upper leg Bathing: 3: Mod-Patient completes 5-7 [id number] parts or 50-74%  FIM - Upper Body Dressing/Undressing Upper body dressing/undressing: 0: Wears gown/pajamas-no public clothing FIM - Lower Body Dressing/Undressing Lower body dressing/undressing: 0: Wears gown/pajamas-no public clothing  FIM - Toileting Toileting steps completed by patient: Adjust clothing after toileting, Performs perineal hygiene, Adjust clothing prior to toileting Toileting: 4: Steadying assist  FIM - TRadio producerDevices: Grab bars Toilet Transfers: 4-To toilet/BSC: Min A (steadying Pt. > 75%), 4-From toilet/BSC: Min A (steadying Pt. > 75%)  FIM - Bed/Chair Transfer Bed/Chair Transfer Assistive Devices: WCopy 4: Bed > Chair or W/C: Min A (steadying Pt. > 75%), 4: Chair or W/C > Bed: Min A (steadying Pt. > 75%)  FIM - Locomotion: Wheelchair Distance: 125 Locomotion: Wheelchair: 2: Travels 543- 149 ft with supervision, cueing or coaxing FIM - Locomotion: Ambulation Locomotion: Ambulation Assistive Devices: WAdministratorAmbulation/Gait Assistance: 3: Mod assist Locomotion: Ambulation: 2: Travels 50 - 149 ft with moderate assistance (Pt: 50 - 74%)  Comprehension Comprehension Mode: Auditory Comprehension: 5-Understands basic 90% of the time/requires cueing < 10% of the time  Expression Expression Mode:  Verbal Expression: 5-Expresses basic needs/ideas: With extra time/assistive device  Social Interaction Social Interaction: 5-Interacts appropriately 90% of the time - Needs monitoring or encouragement for participation or interaction.  Problem Solving Problem Solving: 5-Solves basic 90% of the time/requires cueing < 10% of the time  Memory Memory: 5-Recognizes or recalls 90% of the time/requires cueing < 10% of the time  Medical Problem List and Plan: 1. Functional deficits secondary to left basal ganglia/corona radiata infarct 2. DVT Prophylaxis/Anticoagulation: Pharmaceutical: Lovenox--indicated due to immobility, Plavix for CVA 3. Pain Management: N/A.  4. Mood: Team to provide ego support. LCSW to follow for evaluation and support.  5. Neuropsych: This patient is capable of making decisions on his own behalf. 6. Skin/Wound Care: Routine pressure relief measures. Maintain adequate fluid and nutritional intake.  7. Fluids/Electrolytes/Nutrition: Monitor I/O. Check lytes in am. Offer supplements prn poor intake.  8. DM type 2: Poorly controlled due to dietary non-compliance. Will get RD to help with diet education. Will monitor BS with ac/hs CBG checks. Increase levemir at bedtime with SSI for tighter blood sugar control. Continue to hold metformin due to renal insufficiency CKD. 9. HTN: Monitor BP every 8 hours. Continue losartan, coreg and hytrin. Titrate medications as needed.  10. Cellulitis due to cat bite: Continue Augmentin--antibiotic Day #7/7-resolved 11. CKD?: Baseline BUN/Cr- 22/1.58. 12. Dyslipidemia: continue Crestor.  13. Low protein stores: Will add prosource supplements.  4. Left knee pain: potential gout flare.Improved after short course of Pred  LOS (Days) 3 A FACE TO FACE EVALUATION WAS PERFORMED  KIRSTEINS,ANDREW E 08/15/2014, 7:31 AM   -r

## 2014-08-15 NOTE — Progress Notes (Signed)
Occupational Therapy Session Note  Patient Details  Name: George Stevens MRN: [id number] Date of Birth: [birth year]  Today's Date: 08/15/2014 OT Individual Time: 1003-1102 OT Individual Time Calculation (min): 59 min    Skilled Therapeutic Interventions/Progress Updates:    Pt worked on [employer] coordination and use during session.  Performed 9 hold peg test sitting EOM in 40 and 31 seconds on the right and 42 seconds on the left.  Also worked on ball toss with BUEs and medium sized ball as well as with smaller tennis ball.  He was able to efficiently catch and toss the medium sized ball without much difficulty but demonstrated significant difficulty with the smaller tennis ball.  He was only able to toss and catch the tennis ball approximately 50% of the time.  Finished session with sit to stand transitions, without use of UEs.  Pt still demonstrates decreased forward trunk flexion and exhibits increased weight on his heels requiring min facilitation for sit to stand.  Returned to room via wheelchair at end of session and left with call button in reach.    Therapy Documentation Precautions:  Precautions Precautions: Fall Restrictions Weight Bearing Restrictions: No  Pain: Pain Assessment Pain Assessment: 0-10 Pain Score: 2  Pain Type: Acute pain Pain Location: Knee Pain Orientation: Right Pain Intervention(s): Repositioned ADL: See FIM for current functional status  Therapy/Group: Individual Therapy  Malcome Ambrocio OTR/L 08/15/2014, 12:45 PM

## 2014-08-15 NOTE — Progress Notes (Signed)
Patient information reviewed and entered into eRehab system by Zoey Bidwell, RN, CRRN, PPS Coordinator.  Information including medical coding and functional independence measure will be reviewed and updated through discharge.     Per nursing patient was given "Data Collection Information Summary for Patients in Inpatient Rehabilitation Facilities with attached "Privacy Act Statement-Health Care Records" upon admission.  

## 2014-08-15 NOTE — Progress Notes (Signed)
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 Lives with spouse independent prior to admission. Presented 08/09/2014 with right-sided weakness, facial droop and slurred speech. Cranial CT scan negative. 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 MRA negative for stenosis or occlusion. Carotid Dopplers with no ICA stenosis. Echocardiogram with ejection fraction of 65% no wall motion abnormalities with grade 1 diastolic dysfunction. Patient did receive TPA. Neurology consulted maintained on Plavix for CVA prophylaxis. Maintain on a regular consistency diet. Physical therapy evaluation completed 08/10/2014 with recommendations of physical medicine rehabilitation consult.   Review of Systems  Constitutional: Negative for fever and chills.  Eyes: Negative for blurred vision.  Respiratory: Negative for cough and shortness of breath.  Cardiovascular: Negative for chest pain, palpitations and leg swelling.  Gastrointestinal: Positive for nausea and constipation.  Genitourinary: Positive for urgency and frequency.  Musculoskeletal: Positive for myalgias and joint pain.  Skin: Negative for rash.  Neurological: Positive for dizziness, speech change and weakness. Negative for seizures and headaches.   Past Medical History  Diagnosis Date  . Hypertension   . Diabetes mellitus without complication     Past Surgical History  Procedure Laterality Date  . Eye surgery     Family History  Problem Relation Age of Onset  . Hypertension Mother   . Hypertension Father    Social History:  reports that he has been smoking Pipe. He has never used smokeless tobacco. He reports that he drinks alcohol. He reports that he does not use illicit drugs. Allergies: No Known Allergies Medications Prior to Admission  Medication Sig Dispense Refill  . aspirin 81 MG chewable tablet Chew 81 mg by mouth daily.    Marland Kitchen atorvastatin (LIPITOR) 40 MG tablet Take 40 mg by mouth daily.    . carvedilol (COREG) 25 MG tablet Take 25 mg by mouth 2 (two) times daily with a meal.    . insulin detemir (LEVEMIR) 100 UNIT/ML injection Inject 70 Units into the skin at bedtime.    . insulin lispro (HUMALOG) 100 UNIT/ML injection Inject 25 Units into the skin 3 (three) times daily before meals.    Marland Kitchen losartan-hydrochlorothiazide (HYZAAR) 100-25 MG per tablet Take 1 tablet by mouth daily.    . metFORMIN (GLUCOPHAGE) 1000 MG tablet Take 1,000 mg by mouth daily with breakfast.    . terazosin (HYTRIN) 5 MG capsule Take 5 mg by mouth at bedtime.      Home: Home Living Family/patient expects to be discharged to:: Private residence Living Arrangements: Spouse/significant other Available Help at Discharge: Family Type of Home: House Home Access: Stairs to enter Secretary/administrator of Steps: 3 Entrance Stairs-Rails: Can reach both Home Layout: Multi-level, Bed/bath upstairs Alternate Level Stairs-Number of Steps: 12 Alternate Level Stairs-Rails: Can reach both Bathroom Shower/Tub: Health visitor: Standard Home Equipment: None  Functional History: Prior Function Level of Independence: Independent Functional Status:  Mobility: Bed Mobility Overal bed mobility: Needs Assistance Bed Mobility: Rolling, Sidelying to Sit Rolling: Min  assist  Sidelying to sit: Mod assist General bed mobility comments: Assist to power up to sitting position. Patient able to initiate LE movement but required assist for RLE to position and roll. Assist to rotate trunk to EOb Transfers Overall transfer level: Needs assistance Equipment used: (Bilateral UE support via back of chair) Transfers: Sit to/from Stand Sit to Stand: Mod assist General transfer comment: RLE buckling with elevation to standing, knee block required, poor ability to power up secondary to RUE weakness. Ambulation/Gait General Gait Details: unable to perform, RLE could not withstand stance or initate stride    ADL:    Cognition: Cognition Overall Cognitive Status: Within Functional Limits for tasks assessed Orientation Level: Oriented X4 Cognition Arousal/Alertness: Awake/alert Behavior During Therapy: WFL for tasks assessed/performed Overall Cognitive Status: Within Functional Limits for tasks assessed  Blood pressure 199/75, pulse 73, temperature 98.3 F (36.8 C), temperature source Oral, resp. rate 15, height 5\' 5"  (1.651 m), weight 93.2 kg (205 lb 7.5 oz), SpO2 96 %. Physical Exam  Constitutional: He is oriented to person, place, and time.  HENT:  Head: Normocephalic.  Eyes: EOM are normal.  Neck: Normal range of motion. Neck supple. No thyromegaly present.  Cardiovascular: Normal rate and regular rhythm.  Respiratory: Effort normal and breath sounds normal. No respiratory distress.  GI: Soft. Bowel sounds are normal. He exhibits no distension.  Neurological: He is alert and oriented to person, place, and time.  Following full commands. Good awareness of deficits. Speech is mildly dysarthric but fully intelligible. Minimal right 7. Cognitively appears appropriate. RUE 4- to 4/5 proximal to distal with pronator drift. RLE: 3+ HF, 4-KE and 4/5 ADF/APF. Decreased FMC on right. Senses pain and light touch although may be somewhat decreased in distal legs/feet.  DTR's trace to 1+. No resting tone. No Babinski  Skin: Skin is warm and dry.  Psychiatric: He has a normal mood and affect. His behavior is normal. Thought content normal.     Lab Results Last 24 Hours    Results for orders placed or performed during the hospital encounter of 08/09/14 (from the past 24 hour(s))  Glucose, capillary Status: Abnormal   Collection Time: 08/09/14 3:28 PM  Result Value Ref Range   Glucose-Capillary 160 (H) 65 - 99 mg/dL  Glucose, capillary Status: Abnormal   Collection Time: 08/09/14 9:40 PM  Result Value Ref Range   Glucose-Capillary 232 (H) 65 - 99 mg/dL  Lipid panel Status: Abnormal   Collection Time: 08/10/14 2:38 AM  Result Value Ref Range   Cholesterol 160 0 - 200 mg/dL   Triglycerides 161 (H) <150 mg/dL   HDL 34 (L) >09 mg/dL   Total CHOL/HDL Ratio 4.7 RATIO   VLDL 35 0 - 40 mg/dL   LDL Cholesterol 91 0 - 99 mg/dL  Glucose, capillary Status: Abnormal   Collection Time: 08/10/14 8:10 AM  Result Value Ref Range   Glucose-Capillary 286 (H) 65 - 99 mg/dL   Comment 1 Notify RN    Comment 2 Document in Chart   Glucose, capillary Status: Abnormal   Collection Time: 08/10/14 12:58 PM  Result Value Ref Range   Glucose-Capillary 221 (H) 65 - 99 mg/dL      Imaging Results (Last 48 hours)    Ct Head Wo Contrast  08/09/2014 CLINICAL DATA: Right-sided weakness EXAM: CT HEAD WITHOUT CONTRAST TECHNIQUE: Contiguous axial images were obtained from the base of the skull through the vertex without intravenous contrast. COMPARISON: None. FINDINGS: Generalized atrophy. Mild chronic microvascular white matter .  Negative for acute infarct. Negative for hemorrhage or mass. Ventricle size normal. No brain edema or shift of the midline structures. Calvarium intact. IMPRESSION: No acute intracranial abnormality Critical Value/emergent results were called  by telephone at the time of interpretation on 08/09/2014 at 10:32 am to [facility] , who verbally acknowledged these results. Electronically Signed By: Marlan Palau M.D. On: 08/09/2014 10:32   Mr Brain Wo Contrast  08/10/2014 CLINICAL DATA: Stroke with dysarthria, right facial droop common right leg weakness. TPA given. EXAM: MRI HEAD WITHOUT CONTRAST MRA HEAD WITHOUT CONTRAST TECHNIQUE: Multiplanar, multiecho pulse sequences of the brain and surrounding structures were obtained without intravenous contrast. Angiographic images of the head were obtained using MRA technique without contrast. COMPARISON: None. FINDINGS: MRI HEAD FINDINGS Calvarium and upper cervical spine: No focal marrow signal abnormality. Orbits: Bilateral cataract resection. Sinuses and Mastoids: Clear. Mastoid and middle ears are clear. Brain: Restricted diffusion in the posterior left putamen, internal capsule, up to the posterior body of the caudate nucleus. Favor anterior choroidal distribution upper lateral lenticulostriate given the posterior appearance, even though there is sparing along the mesial temporal lobe and involvement of the posterior putamen. No superimposed hemorrhage. No additional infarct. Mild chronic small vessel disease with ischemic gliosis patchy throughout the bilateral cerebral white matter. There is dense gliosis and encephalomalacia in the inferior and polar right more than left frontal lobes, cortical and subcortical. No evidence of major vessel occlusion, hydrocephalus, mass lesion. MRA HEAD FINDINGS Anterior and left posterior communicating arteries are present. Strong dominance of the left vertebral artery. No definite major branch occlusion, stenosis, or vascular malformation. A 2 mm outpouching from the left carotid terminus, directed inferiorly and anteriorly could reflect a infundibulum (possibly of an occluded anterior choroidal artery) or aneurysm. The outpouching is beyond the left  posterior communicating artery origin. IMPRESSION: 1. 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 2. MRA negative for treatable stenosis or occlusion. 3. 2 mm left carotid terminus outpouching which could reflect infundibulum or aneurysm. 4. Bifrontal gliosis, pattern consistent with remote cerebral contusions. Electronically Signed By: Marnee Spring M.D. On: 08/10/2014 12:34   Mr Maxine Glenn Head/brain Wo Cm  08/10/2014 CLINICAL DATA: Stroke with dysarthria, right facial droop common right leg weakness. TPA given. EXAM: MRI HEAD WITHOUT CONTRAST MRA HEAD WITHOUT CONTRAST TECHNIQUE: Multiplanar, multiecho pulse sequences of the brain and surrounding structures were obtained without intravenous contrast. Angiographic images of the head were obtained using MRA technique without contrast. COMPARISON: None. FINDINGS: MRI HEAD FINDINGS Calvarium and upper cervical spine: No focal marrow signal abnormality. Orbits: Bilateral cataract resection. Sinuses and Mastoids: Clear. Mastoid and middle ears are clear. Brain: Restricted diffusion in the posterior left putamen, internal capsule, up to the posterior body of the caudate nucleus. Favor anterior choroidal distribution upper lateral lenticulostriate given the posterior appearance, even though there is sparing along the mesial temporal lobe and involvement of the posterior putamen. No superimposed hemorrhage. No additional infarct. Mild chronic small vessel disease with ischemic gliosis patchy throughout the bilateral cerebral white matter. There is dense gliosis and encephalomalacia in the inferior and polar right more than left frontal lobes, cortical and subcortical. No evidence of major vessel occlusion, hydrocephalus, mass lesion. MRA HEAD FINDINGS Anterior and left posterior communicating arteries are present. Strong dominance of the left vertebral artery. No definite  major branch occlusion, stenosis, or vascular malformation. A 2 mm outpouching from the left carotid terminus, directed inferiorly and anteriorly could reflect a infundibulum (possibly  of an occluded anterior choroidal artery) or aneurysm. The outpouching is beyond the left posterior communicating artery origin. IMPRESSION: 1. 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 2. MRA negative for treatable stenosis or occlusion. 3. 2 mm left carotid terminus outpouching which could reflect infundibulum or aneurysm. 4. Bifrontal gliosis, pattern consistent with remote cerebral contusions. Electronically Signed By: Marnee Spring M.D. On: 08/10/2014 12:34     Assessment/Plan: Diagnosis: left basal ganglia/corona radiata infarct 1. Does the need for close, 24 hr/day medical supervision in concert with the patient's rehab needs make it unreasonable for this patient to be served in a less intensive setting? Yes 2. Co-Morbidities requiring supervision/potential complications: dm, htn, dpn 3. Due to bladder management, bowel management, safety, skin/wound care, disease management, medication administration, pain management and patient education, does the patient require 24 hr/day rehab nursing? Yes 4. Does the patient require coordinated care of a physician, rehab nurse, PT (1-2 hrs/day, 5 days/week) and OT (1-2 hrs/day, 5 days/week), SLP? to address physical and functional deficits in the context of the above medical diagnosis(es)? Yes Addressing deficits in the following areas: balance, endurance, locomotion, strength, transferring, bowel/bladder control, bathing, dressing, feeding, grooming, toileting and psychosocial support 5. Can the patient actively participate in an intensive therapy program of at least 3 hrs of therapy per day at least 5 days per week? Yes 6. The potential for patient to make measurable gains while on  inpatient rehab is excellent 7. Anticipated functional outcomes upon discharge from inpatient rehab are modified independent with PT, modified independent with OT, n/a with SLP. 8. Estimated rehab length of stay to reach the above functional goals is: 8-12 days 9. Does the patient have adequate social supports and living environment to accommodate these discharge functional goals? Yes 10. Anticipated D/C setting: Home 11. Anticipated post D/C treatments: HH therapy and Outpatient therapy 12. Overall Rehab/Functional Prognosis: excellent  RECOMMENDATIONS: This patient's condition is appropriate for continued rehabilitative care in the following setting: CIR Patient has agreed to participate in recommended program. Yes Note that insurance prior authorization may be required for reimbursement for recommended care.  Comment: Rehab Admissions Coordinator to follow up.  Thanks,  Ranelle Oyster, MD, [place] [facility]     08/10/2014       Revision History     Date/Time User Provider Type Action   08/11/2014 11:13 AM Ranelle Oyster, MD Physician Sign   08/11/2014 5:33 AM Charlton Amor, PA-C Physician Assistant Pend   View Details Report       Routing History     Date/Time From To Method   08/11/2014 11:13 AM Ranelle Oyster, MD Ranelle Oyster, MD In Basket

## 2014-08-15 NOTE — Progress Notes (Signed)
Andrey Farmer Cayli Escajeda Rehab Admission Coordinator Signed Physical Medicine and Rehabilitation PMR Pre-admission 08/11/2014 1:53 PM  Related encounter: ED to Hosp-Admission (Discharged) from 08/09/2014 in [facility]    Expand All Collapse All   PMR Admission Coordinator Pre-Admission Assessment  Patient: Coleson Kant is an 74 y.o., male MRN: [id number] DOB: 09-Sep-1940 Height: 5\' 5"  (165.1 cm) Weight: 93.2 kg (205 lb 7.5 oz)  Insurance Information HMO: PPO: yes PCP: IPA: 80/20: OTHER: medicare advantage plan PRIMARY: [employer] medicare Policy#Marland Mcalpine, Group # [id number] Subscriber: pt CM Name: Karel Jarvis Phone#: [phone] ext [phone] Fax#: [phone] Authorization given for 10 days from [place] with updates due to [place] at above fax on 07-06-50 Pre-Cert#: [id number] Employer: retired Benefits: Phone #: online Name: 08/11/14 Eff. Date: 01/07/14 Deduct: none Out of Pocket Max: $4950 Life Max: none CIR: $285 per day days 1-6/ $1710 max SNF: no copay days 1-20; $160 copay per day days 21-100 Outpatient: $40 copay per visit Co-Pay: no visit limit Home Health: 100% Co-Pay: no visit limit DME: 80% Co-Pay: 20% Providers: in network  SECONDARY: none   Medicaid Application Date: Case Manager:  Disability Application Date: Case Worker:   Emergency Conservator, museum/gallery Information    Name Relation Home Work Mobile   Bertha,Frances Spouse [phone]  [phone]   Mohon,[name] Daughter   [phone]     Current Medical History  Patient Admitting Diagnosis: left basal ganglia/corona radiata infarct  History of Present Illness: Alroy Portela is a 74 y.o.RH male with history of  HTN, DM type 2, who was admitted on 08/09/2014 with right-sided weakness, facial droop and slurred speech. 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 MRA was negative for stenosis or occlusion. Carotid Dopplers without ICA stenosis. 2D echo with ejection fraction of 65% no wall motion abnormalities with grade 1 diastolic dysfunction. Patient did receive TPA and was started on Unasyn due to cellulitis from recent cat bite. . Dr. 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 Sleep study recommended on outpatient basis due to concerns of sleep apnea.   Total: 2 NIH    Past Medical History  Past Medical History  Diagnosis Date  . Hypertension   . Diabetes mellitus without complication     Family History  family history includes Hypertension in his father and mother.  Prior Rehab/Hospitalizations:  Has the patient had major surgery during 100 days prior to admission? No  Current Medications   Current facility-administered medications:  . acetaminophen (TYLENOL) tablet 650 mg, 650 mg, Oral, Q4H PRN **OR** [DISCONTINUED] acetaminophen (TYLENOL) suppository 650 mg, 650 mg, Rectal, Q4H PRN, Ulice Dash, PA-C . amoxicillin-clavulanate (AUGMENTIN) 500-125 MG per tablet 500 mg, 1 tablet, Oral, Q12H, Marvel Plan, MD, 500 mg at 08/12/14 9518 . atorvastatin (LIPITOR) tablet 80 mg, 80 mg, Oral, Daily, Marvel Plan, MD, 80 mg at 08/12/14 0949 . carvedilol (COREG) tablet 25 mg, 25 mg, Oral, BID WC, Marvel Plan, MD, 25 mg at 08/12/14 0827 . clopidogrel (PLAVIX) tablet 75 mg, 75 mg, Oral, Daily, Marvel Plan, MD, 75 mg at 08/12/14 0949 . insulin aspart (novoLOG) injection 0-15 Units, 0-15 Units, Subcutaneous, TID WC, Marvel Plan, MD, 2 Units at 08/11/14 1235 . insulin aspart (novoLOG) injection 25 Units, 25 Units,  Subcutaneous, TID WC, Marvel Plan, MD, 20 Units at 08/11/14 1733 . insulin detemir (LEVEMIR) injection 70 Units, 70 Units,  Subcutaneous, QHS, Marvel Plan, MD, 70 Units at 08/11/14 2356 . labetalol (NORMODYNE,TRANDATE) injection 10 mg, 10 mg, Intravenous, Q10 min PRN, Ulice Dash, PA-C, 10 mg at 08/10/14 0855 . losartan (COZAAR) tablet 100 mg, 100 mg, Oral, Daily, Marvel Plan, MD, 100 mg at 08/12/14 0949 . pantoprazole (PROTONIX) EC tablet 40 mg, 40 mg, Oral, Daily, Marvel Plan, MD, 40 mg at 08/12/14 0949 . senna-docusate (Senokot-S) tablet 1 tablet, 1 tablet, Oral, QHS PRN, Ulice Dash, PA-C . terazosin (HYTRIN) capsule 5 mg, 5 mg, Oral, QHS, Marvel Plan, MD, 5 mg at 08/11/14 2356  Patients Current Diet: Diet heart healthy/carb modified Room service appropriate?: Yes; Fluid consistency:: Thin  Precautions / Restrictions Precautions Precautions: Fall Restrictions Weight Bearing Restrictions: No   Has the patient had 2 or more falls or a fall with injury in the past year?No  Prior Activity Level Community (5-7x/wk): retired Counselling psychologist for 14 years. Independent, travels, socializes with family  Journalist, newspaper / Equipment Home Assistive Devices/Equipment: None Home Equipment: None  Prior Device Use: Indicate devices/aids used by the patient prior to current illness, exacerbation or injury? None of the above  Prior Functional Level Prior Function Level of Independence: Independent  Self Care: Did the patient need help bathing, dressing, using the toilet or eating? Independent  Indoor Mobility: Did the patient need assistance with walking from room to room (with or without device)? Independent  Stairs: Did the patient need assistance with internal or external stairs (with or without device)? Independent  Functional Cognition: Did the patient need help planning regular tasks such as shopping or remembering to take medications? Independent  Current Functional  Level Cognition  Arousal/Alertness: Awake/alert Overall Cognitive Status: Impaired/Different from baseline Orientation Level: Oriented X4 Attention: Selective Selective Attention: Impaired Selective Attention Impairment: Verbal basic Memory: Impaired Memory Impairment: Retrieval deficit, Decreased recall of new information Awareness: Appears intact Problem Solving: Impaired Problem Solving Impairment: Verbal complex Safety/Judgment: Appears intact   Extremity Assessment (includes Sensation/Coordination)  Upper Extremity Assessment: RUE deficits/detail RUE Deficits / Details: RUE AROM WFL. generalized weakness. Tone appears normal. Pt with isolated movement, but clumsy patterns. Pt substituting to compensate for weakn RUE. Trying to use RUE in functional tasks. Pt states 'mu arm just won't do what I want it to do"Moto impersistence RUE Sensation: (appears intact) RUE Coordination: decreased fine motor, decreased gross motor (able to hold washcloth, toothbrush, but difficulty with in h)  Lower Extremity Assessment: Defer to PT evaluation (r knee buckling. ) RLE Deficits / Details: decreased strength RLE, 2+/5 fross motions, poor functional strength. RLE Sensation: decreased proprioception RLE Coordination: decreased fine motor, decreased gross motor    ADLs  Overall ADL's : Needs assistance/impaired Eating/Feeding: Set up Grooming: Minimal assistance Upper Body Bathing: Minimal assitance, Sitting Lower Body Bathing: Moderate assistance, Sit to/from stand Upper Body Dressing : Moderate assistance, Sitting Lower Body Dressing: Moderate assistance, Sit to/from stand Toilet Transfer: Moderate assistance Toileting- Clothing Manipulation and Hygiene: Moderate assistance Functional mobility during ADLs: Moderate assistance General ADL Comments: Pt very motivated to participate with ADL. Note poor midline orientation and trunk control during ADL. Motor impersistance during ADL.  Initiates use of RUE.     Mobility  Overal bed mobility: Needs Assistance Bed Mobility: Rolling, Sidelying to Sit Rolling: Min assist Sidelying to sit: Min assist General bed mobility comments: patient able to roll to EOB with min assist and cues, improved ability to power up to sitting, min assist for stability at EOB.    Transfers  Overall transfer level: Needs assistance Equipment used: Rolling walker (2 wheeled) Transfers: Sit to/from Stand, [employer] Transfers Sit to Stand: Mod assist, +2 physical assistance Stand pivot transfers: Mod assist General transfer comment: Improved control of RLE for power up but easily fatigued and required cues for setting of RLE upon pivotal transfer. VCs for hand placement with assist for R grip    Ambulation / Gait / Stairs / Wheelchair Mobility  Ambulation/Gait Ambulation/Gait assistance: Mod assist, +2 physical assistance Ambulation Distance (Feet): 16 Feet Assistive device: Rolling walker (2 wheeled) Gait Pattern/deviations: Step-through pattern, Decreased stride length, Ataxic, Decreased dorsiflexion - right, Decreased stance time - right, Decreased step length - right, Decreased weight shift to left, Narrow base of support General Gait Details: patient with poor coordination of giat, decreased ability to stabilize stride length. Assist required to faciliate weight shift to the left for RLE advancement. Assist for upright posture and to maintain stability with RW. Poor overall functional coordiation, continuous cues for sequencing throughout ambulation, 8 steps x2 Gait velocity: decreased Gait velocity interpretation: <1.8 ft/sec, indicative of risk for recurrent falls    Posture / Balance Dynamic Sitting Balance Sitting balance - Comments: patient with posterior lean and difficulty maintaining midline/ R bias Balance Overall balance assessment: Needs assistance Sitting-balance support: Feet supported Sitting balance-Leahy Scale:  Poor Sitting balance - Comments: patient with posterior lean and difficulty maintaining midline/ R bias Postural control: Posterior lean, Right lateral lean Standing balance support: Bilateral upper extremity supported, During functional activity Standing balance-Leahy Scale: Poor Standing balance comment: R lateral lean. difficulty monitoring controlled movement patterns. Able to achieve midline, but unable to maintain midline postural control    Special needs/care consideration Bowel mgmt: LBM 08/11/14 continent Bladder mgmt: continent Diabetic mgmt Hgb A1c 10.7 Needs sleep study as OP for OSA    Previous Home Environment Living Arrangements: Spouse/significant other Lives With: Spouse Available Help at Discharge: Family Type of Home: House Home Layout: Multi-level, Bed/bath upstairs Alternate Level Stairs-Rails: Can reach both Alternate Level Stairs-Number of Steps: 12 Home Access: Stairs to enter Entrance Stairs-Rails: Can reach both Entrance Stairs-Number of Steps: 3 Bathroom Shower/Tub: Health visitor: Standard Bathroom Accessibility: Yes How Accessible: Accessible via walker Home Care Services: No  Discharge Living Setting Plans for Discharge Living Setting: Patient's home, Lives with (comment), Other (Comment) (wife) Type of Home at Discharge: House Discharge Home Layout: Multi-level, Bed/bath upstairs Alternate Level Stairs-Rails: Right, Left, Can reach both Alternate Level Stairs-Number of Steps: 12 Discharge Home Access: Stairs to enter Entrance Stairs-Rails: Can reach both, Right, Left Entrance Stairs-Number of Steps: 3 Discharge Bathroom Shower/Tub: Walk-in shower Discharge Bathroom Toilet: Standard Discharge Bathroom Accessibility: Yes How Accessible: Accessible via walker Does the patient have any problems obtaining your medications?: No  Social/Family/Support Systems Patient Roles: Spouse, Parent Contact Information: Velta Addison Anticipated Caregiver: wife and family Anticipated Caregiver's Contact Information: see above Ability/Limitations of Caregiver: no limitations Caregiver Availability: 24/7 Discharge Plan Discussed with Primary Caregiver: Yes Is Caregiver In Agreement with Plan?: Yes Does Caregiver/Family have Issues with Lodging/Transportation while Pt is in Rehab?: No  Goals/Additional Needs Patient/Family Goal for Rehab: Mod I with PT and OT Expected length of stay: ELOS 8-12 days Cultural Considerations: none Dietary Needs: heart healthy diet Equipment Needs: to be determined Pt/Family Agrees to Admission and willing to participate: Yes Program Orientation Provided & Reviewed with Pt/Caregiver Including Roles & Responsibilities: Yes  Decrease burden of Care through IP rehab admission: n/a  Possible need for SNF placement upon  discharge:not anticipated  Patient Condition: This patient's condition remains as documented in the consult dated 08/11/14, in which the Rehabilitation Physician determined and documented that the patient's condition is appropriate for intensive rehabilitative care in an inpatient rehabilitation facility. Will admit to inpatient rehab today.  Preadmission Screen Completed By: Oswald Hillock, 08/12/2014 10:00 AM ______________________________________________________________________  Discussed status with Dr. Riley Kill on 08-12-14 at 1000 and received telephone approval for admission today.  Admission Coordinator: Juliann Mule, PT, time1000/Date 08-12-14          Cosigned by: Ranelle Oyster, MD at 08/12/2014 11:14 AM  Revision History     Date/Time User Provider Type Action   08/12/2014 11:14 AM Ranelle Oyster, MD Physician Cosign   08/12/2014 10:01 AM Andrey Farmer Savyon Loken Rehab Admission Coordinator Sign   08/12/2014 10:00 AM Andrey Farmer Kirti Carl Rehab Admission Coordinator Share   08/12/2014 9:59 AM Andrey Farmer Quantisha Marsicano Rehab Admission Coordinator Share    08/12/2014 9:57 AM Andrey Farmer Rasheena Talmadge Rehab Admission Coordinator Share   08/11/2014 2:28 PM Standley Brooking, RN Rehab Admission Coordinator Share   08/11/2014 2:06 PM Standley Brooking, RN Rehab Admission Coordinator Share   View Details Report

## 2014-08-16 ENCOUNTER — Inpatient Hospital Stay (HOSPITAL_COMMUNITY): Payer: Medicare HMO

## 2014-08-16 ENCOUNTER — Inpatient Hospital Stay (HOSPITAL_COMMUNITY): Payer: Medicare HMO | Admitting: *Deleted

## 2014-08-16 ENCOUNTER — Inpatient Hospital Stay (HOSPITAL_COMMUNITY): Payer: Medicare HMO | Admitting: Physical Therapy

## 2014-08-16 LAB — GLUCOSE, CAPILLARY
GLUCOSE-CAPILLARY: 247 mg/dL — AB (ref 65–99)
Glucose-Capillary: 207 mg/dL — ABNORMAL HIGH (ref 65–99)
Glucose-Capillary: 190 mg/dL — ABNORMAL HIGH (ref 65–99)
Glucose-Capillary: 115 mg/dL — ABNORMAL HIGH (ref 65–99)

## 2014-08-16 MED ORDER — GLIPIZIDE 2.5 MG HALF TABLET
2.5000 mg | ORAL_TABLET | Freq: Every day | ORAL | Status: DC
  Administered 2014-08-16 – 2014-08-17 (×2): 2.5 mg via ORAL
  Filled 2014-08-16 (×3): qty 1

## 2014-08-16 NOTE — Progress Notes (Signed)
Physical Therapy Session Note  Patient Details  Name: George Stevens MRN: [id number] Date of Birth: [birth year]  Today's Date: 08/16/2014 PT Individual Time: 1605-1705 PT Individual Time Calculation (min): 60 min   Short Term Goals: Week 1:  PT Short Term Goal 1 (Week 1): Pt will perform supine to edge of bed. edge of bed to supine with mod I PT Short Term Goal 2 (Week 1): Pt will transfer bed to chair, chair to bed with min A.  PT Short Term Goal 3 (Week 1): Pt will ambulate with rolling walker and min A about 150 feet.  PT Short Term Goal 4 (Week 1): Pt will propel w/c about 150 feet with S.  PT Short Term Goal 5 (Week 1): Pt will ascend/descend 12 stairs with 1 rail and min A.   Skilled Therapeutic Interventions/Progress Updates:  Session focused on NMR, and functional mobility. Pt ambulated with RW 200 feet X 2 requiring overall supervision and min to mod A with turning, pt demonstrates decreased R foot clearance, R hip and knee instability. Pt performed bilateral standing hip abduction and hip extension to facilitate hip stability and weight shifting varying from bilat UE support to fingertip support to no UE support requiring overall mod to max A and mod cuing for posture and R knee extension. Pt performed side stepping 20 feet X 4 requiring min to mod A and intermittent UE support. Pt ambulated with RW requiring min assist for steadying to navigate 90 degree turns through cones, and for stepping over low objects with min cuing for technique. Pt performed forward step weight shifting onto R LE with emphasis on heel striking X 20. Pt appeared to show slightly improved R foot clearance while ambulating with RW back to room with mod cuing to remind him to heel strike. Pt left sitting in recliner with all needs in reach and family present.  Therapy Documentation Precautions:  Precautions Precautions: Fall Restrictions Weight Bearing Restrictions: No Pain: Pain Assessment Pain Assessment:  Faces Faces Pain Scale: Hurts a little bit Pain Type: Chronic pain Pain Location: Knee Pain Orientation: Left Pain Descriptors / Indicators: Aching Pain Intervention(s): Repositioned;Rest  See FIM for current functional status  Therapy/Group: Individual Therapy  George Stevens 08/16/2014, 5:57 PM

## 2014-08-16 NOTE — Progress Notes (Signed)
74 y.o. Right handed male with history of HTN, DM type 2, who was admitted on 08/09/2014 with right-sided weakness, facial droop and slurred speech. MRI of the head showed acute nonhemorrhagic perforator infarct on the left favoring anterior chorodial over lateral lenticulostriate distribution and bifrontal gliosis c/w remote cerebral contusions. MRA was negative for stenosis or occlusion. Carotid Dopplers without ICA stenosis. 2D echo with ejection fraction of 65% no wall motion abnormalities with grade 1 diastolic dysfunction. Patient did receive TPA  Subjective/Complaints:  Just woke up No C/os Discussed BP and CBG management Had troubles regulating BP at home  ROS- neg CP, SOB, Abd pain, appetite ok  Objective: Vital Signs: Blood pressure 177/82, pulse 73, temperature 98.3 F (36.8 C), temperature source Oral, resp. rate 17, height 5' 5"  (1.651 m), weight 84.9 kg (187 lb 2.7 oz), SpO2 96 %. No results found. Results for orders placed or performed during the hospital encounter of 08/12/14 (from the past 72 hour(s))  Glucose, capillary     Status: Abnormal   Collection Time: 08/13/14 11:24 AM  Result Value Ref Range   Glucose-Capillary 233 (H) 65 - 99 mg/dL  Glucose, capillary     Status: Abnormal   Collection Time: 08/13/14  4:30 PM  Result Value Ref Range   Glucose-Capillary 231 (H) 65 - 99 mg/dL   Comment 1 Notify RN   Glucose, capillary     Status: Abnormal   Collection Time: 08/13/14  9:03 PM  Result Value Ref Range   Glucose-Capillary 261 (H) 65 - 99 mg/dL  Glucose, capillary     Status: Abnormal   Collection Time: 08/14/14  7:13 AM  Result Value Ref Range   Glucose-Capillary 215 (H) 65 - 99 mg/dL  Basic metabolic panel     Status: Abnormal   Collection Time: 08/14/14  8:28 AM  Result Value Ref Range   Sodium 136 135 - 145 mmol/L   Potassium 4.0 3.5 - 5.1 mmol/L   Chloride 102 101 - 111 mmol/L   CO2 24 22 - 32 mmol/L   Glucose, Bld 244 (H) 65 - 99 mg/dL   BUN 28  (H) 6 - 20 mg/dL   Creatinine, Ser 1.63 (H) 0.61 - 1.24 mg/dL   Calcium 9.1 8.9 - 10.3 mg/dL   GFR calc non Af Amer 40 (L) >60 mL/min   GFR calc Af Amer 46 (L) >60 mL/min    Comment: (NOTE) The eGFR has been calculated using the CKD EPI equation. This calculation has not been validated in all clinical situations. eGFR's persistently <60 mL/min signify possible Chronic Kidney Disease.    Anion gap 10 5 - 15  Glucose, capillary     Status: Abnormal   Collection Time: 08/14/14 11:37 AM  Result Value Ref Range   Glucose-Capillary 225 (H) 65 - 99 mg/dL   Comment 1 Notify RN   Glucose, capillary     Status: Abnormal   Collection Time: 08/14/14  2:02 PM  Result Value Ref Range   Glucose-Capillary 217 (H) 65 - 99 mg/dL   Comment 1 Document in Chart   Glucose, capillary     Status: Abnormal   Collection Time: 08/14/14  5:41 PM  Result Value Ref Range   Glucose-Capillary 303 (H) 65 - 99 mg/dL   Comment 1 Notify RN   Glucose, capillary     Status: Abnormal   Collection Time: 08/14/14  8:48 PM  Result Value Ref Range   Glucose-Capillary 223 (H) 65 - 99 mg/dL  Comment 1 Notify RN   Glucose, capillary     Status: Abnormal   Collection Time: 08/15/14  7:07 AM  Result Value Ref Range   Glucose-Capillary 120 (H) 65 - 99 mg/dL   Comment 1 Notify RN   Glucose, capillary     Status: Abnormal   Collection Time: 08/15/14 11:33 AM  Result Value Ref Range   Glucose-Capillary 213 (H) 65 - 99 mg/dL  Glucose, capillary     Status: Abnormal   Collection Time: 08/15/14  4:44 PM  Result Value Ref Range   Glucose-Capillary 328 (H) 65 - 99 mg/dL  Glucose, capillary     Status: Abnormal   Collection Time: 08/15/14  8:49 PM  Result Value Ref Range   Glucose-Capillary 250 (H) 65 - 99 mg/dL   Comment 1 Notify RN      HEENT: normal Cardio: RRR and no murmur Resp: CTA B/L and unlabored GI: BS positive and NT, ND Extremity:  Pulses positive and No Edema Skin:   Intact Neuro: Alert/Oriented,  Normal Sensory, Abnormal Motor 4/5 on RIght side , 5/5 on Left and Abnormal FMC Ataxic/ dec FMC Musc/Skel:  Other minimal Left lateral knee tenderness Gen NAD   Assessment/Plan: 1. 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 Physiatrist is providing close team supervision and 24 hour management of active medical problems listed below. Physiatrist and rehab team continue to assess barriers to discharge/monitor patient progress toward functional and medical goals. FIM: FIM - Bathing Bathing Steps Patient Completed: Chest, Right Arm, Left Arm, Abdomen, Front perineal area, Right upper leg, Left upper leg Bathing: 3: Mod-Patient completes 5-7 28f10 parts or 50-74%  FIM - Upper Body Dressing/Undressing Upper body dressing/undressing: 0: Wears gown/pajamas-no public clothing FIM - Lower Body Dressing/Undressing Lower body dressing/undressing: 0: Wears gown/pajamas-no public clothing  FIM - Toileting Toileting steps completed by patient: Adjust clothing after toileting, Performs perineal hygiene, Adjust clothing prior to toileting Toileting: 4: Steadying assist  FIM - TRadio producerDevices: Grab bars Toilet Transfers: 4-To toilet/BSC: Min A (steadying Pt. > 75%), 4-From toilet/BSC: Min A (steadying Pt. > 75%)  FIM - Bed/Chair Transfer Bed/Chair Transfer Assistive Devices: WCopy 4: Bed > Chair or W/C: Min A (steadying Pt. > 75%), 4: Chair or W/C > Bed: Min A (steadying Pt. > 75%)  FIM - Locomotion: Wheelchair Distance: 125 Locomotion: Wheelchair: 2: Travels 571- 149 ft with supervision, cueing or coaxing FIM - Locomotion: Ambulation Locomotion: Ambulation Assistive Devices: WAdministratorAmbulation/Gait Assistance: 3: Mod assist Locomotion: Ambulation: 2: Travels 50 - 149 ft with moderate  assistance (Pt: 50 - 74%)  Comprehension Comprehension Mode: Auditory Comprehension: 6-Follows complex conversation/direction: With extra time/assistive device  Expression Expression Mode: Verbal Expression: 6-Expresses complex ideas: With extra time/assistive device  Social Interaction Social Interaction: 7-Interacts appropriately with others - No medications needed.  Problem Solving Problem Solving: 6-Solves complex problems: With extra time  Memory Memory: 6-More than reasonable amt of time  Medical Problem List and Plan: 1. Functional deficits secondary to left basal ganglia/corona radiata infarct 2. DVT Prophylaxis/Anticoagulation: Pharmaceutical: Lovenox--indicated due to immobility, Plavix for CVA 3. Pain Management: N/A.  4. Mood: Team to provide ego support. LCSW to follow for evaluation and support.  5. Neuropsych: This patient is capable of making decisions on his own behalf. 6. Skin/Wound Care: Routine pressure relief measures. Maintain adequate fluid and  nutritional intake.  7. Fluids/Electrolytes/Nutrition: Monitor I/O. Check lytes in am. 80-100% meals 8. DM type 2: Poorly controlled due to dietary non-compliance. Will get RD to help with diet education. Will monitor BS with ac/hs CBG checks. Increase levemir at bedtime with SSI for tighter blood sugar control. Continue to hold metformin due to renal insufficiency CKD. Add glucotrol Qam for daytime CBG 9. HTN: Monitor BP every 8 hours. Continue losartan, coreg and hytrin. Titrate medications as needed. occ elevated systolics but no pattern, HR in a good range  11. CKD?: Baseline BUN/Cr- 22/1.58. 12. Dyslipidemia: continue Crestor.  13. Low protein stores: Will add prosource supplements.    LOS (Days) 4 A FACE TO FACE EVALUATION WAS PERFORMED  KIRSTEINS,ANDREW E 08/16/2014, 7:24 AM   -r

## 2014-08-16 NOTE — Progress Notes (Signed)
Recreational Therapy Assessment and Plan  Patient Details  Name: George Stevens MRN: [id number] Date of Birth: 09/15/1940 Today's Date: 08/16/2014  Rehab Potential: Good ELOS: 2 weeks   Assessment Clinical Impression:  Problem List:  Patient Active Problem List   Diagnosis Date Noted  . Right hemiparesis 08/12/2014  . 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 Diabetes type 2, uncontrolled   . Cellulitis of right upper extremity   . Essential hypertension   . Primary gout   . Basal ganglia infarction 08/09/2014    Past Medical History:  Past Medical History  Diagnosis Date  . Hypertension   . Diabetes mellitus without complication   . History of colon polyps   . Hesitancy of micturition   . Concussion with brief LOC     Syncope X 1 in 1996. Another episode with fall and loss of sense of taste as well as brief memory deficits.    Past Surgical History:  Past Surgical History  Procedure Laterality Date  . Cataract extraction    . Colonoscopy w/ polypectomy      Assessment & Plan Clinical Impression:  .George Stevens is a 74 y.o. Right handed male with history of HTN, DM type 2, who was admitted on 08/09/2014 with right-sided weakness, facial droop and slurred speech. MRI of the head showed acute nonhemorrhagic perforator infarct on the left favoring anterior chorodial over lateral lenticulostriate distribution and bifrontal gliosis c/w remote cerebral contusions. MRA was negative for stenosis or occlusion. Carotid Dopplers without ICA stenosis. 2D echo with ejection fraction of 65% no wall motion abnormalities with grade 1 diastolic dysfunction. Patient did receive TPA and was started on Unasyn due to cellulitis from recent cat bite. . Dr. Erlinda Hong recommended Plavix for thrombotic stroke due to small vessel disease as well as management of stroke risk factors. Sleep study recommended on outpatient basis due to concerns of  sleep apnea. Patient with resultant dysarthria as well as right sided weakness, right lean with unsteady gait as well as difficulty with recall, problem solving and processing Patient transferred to CIR on 08/12/2014 .      Pt presents with decreased activity tolerance, decreased functional mobility, decreased balance, right sided weakness Limiting pt's independence with leisure/community pursuits.   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, theatre/television/film Other Leisure Interests: Television Leisure Participation Style: With Family/Friends Awareness of Community Resources: Fair-identify 2 post discharge leisure resources Psychosocial / Spiritual Patient agreeable to Pet Therapy: Yes Does patient have pets?: No Social interaction - Mood/Behavior: Cooperative Engineer, drilling for Education?: Yes Recreational Therapy Orientation Orientation -Reviewed with patient: Available activity resources Strengths/Weaknesses Patient Strengths/Abilities: Willingness to participate Patient weaknesses: Physical limitations TR Patient demonstrates impairments in the following area(s): Endurance;Motor;Safety  Plan Rec Therapy Plan Is patient appropriate for Therapeutic Recreation?: Yes Rehab Potential: Good Treatment times per week: Min 1 time per week >20 minutes Estimated Length of Stay: 2 weeks TR Treatment/Interventions: Adaptive equipment instruction;1:1 session;Balance/vestibular training;Functional mobility training;Community reintegration;Patient/family education;Therapeutic activities;Recreation/leisure participation;Therapeutic exercise;UE/LE Coordination activities  Recommendations for other services: None  Discharge Criteria: Patient will be discharged from TR if patient refuses treatment 3 consecutive times without medical reason.  If treatment goals not met,  if there is a change in medical status, if patient makes no progress towards goals or if patient is discharged from hospital.  The above assessment, treatment plan, treatment alternatives and goals were discussed and mutually agreed upon: by patient  [place] 08/16/2014, 3:44 PM

## 2014-08-16 NOTE — Progress Notes (Signed)
Occupational Therapy Session Note  Patient Details  Name: George Stevens MRN: [id number] Date of Birth: [birth year]  Today's Date: 08/16/2014 OT Individual Time: 0900-1000 OT Individual Time Calculation (min): 60 min    Short Term Goals: Week 1:  OT Short Term Goal 1 (Week 1): Pt.will be mod I with LB bathing using AE prn OT Short Term Goal 2 (Week 1): Pt will be mod I with LB dressing using AE PRN OT Short Term Goal 3 (Week 1): Pt will transfer to toilet with RW at mod I OT Short Term Goal 4 (Week 1): Pt will perform 3/3 toileting items at mod I OT Short Term Goal 5 (Week 1): Pt. will SBA with dynamic standing balance in shower stall  Skilled Therapeutic Interventions/Progress Updates:    Pt engaged in BADL retraining including bathing at shower level and dressing with sit<>stand from sink.  Pt completed grooming tasks while standing at sink.  Pt amb with RW in room to gather clothing prior to walking into bathroom.  Pt exhibited one LOB while seated to remove socks and required min A to self correct.  Pt exhibits slight impulsive behaviors when performing functional tasks while standing with RW.  Pt amb with RW to ADL apartment and practiced stepping into simulated walk-in shower.  Pt performed task with min A and min verbal cues for safety and sequencing.  Pt required mod verbal cues to swing RLE through during ambulation.  Pt required steady A during ambulation.  Focus on activity tolerance, increased RUE use during functional tasks, functional amb with RW, sit<>stand, standing balance, and safety awareness.  Therapy Documentation Precautions:  Precautions Precautions: Fall Restrictions Weight Bearing Restrictions: No   Pain: Pain Assessment Pain Assessment: No/denies pain  See FIM for current functional status  Therapy/Group: Individual Therapy  Rich Brave 08/16/2014, 10:08 AM

## 2014-08-16 NOTE — Progress Notes (Addendum)
Physical Therapy Session Note  Patient Details  Name: George Stevens MRN: [id number] Date of Birth: 18-Nov-1940  Today's Date: 08/16/2014 PT Individual Time: 1105-1200 and 13:30-14:00 ( )  PT Individual Time Calculation (min): 55 min   Short Term Goals: Week 1:  PT Short Term Goal 1 (Week 1): Pt will perform supine to edge of bed. edge of bed to supine with mod I PT Short Term Goal 2 (Week 1): Pt will transfer bed to chair, chair to bed with min A.  PT Short Term Goal 3 (Week 1): Pt will ambulate with rolling walker and min A about 150 feet.  PT Short Term Goal 4 (Week 1): Pt will propel w/c about 150 feet with S.  PT Short Term Goal 5 (Week 1): Pt will ascend/descend 12 stairs with 1 rail and min A.   Skilled Therapeutic Interventions/Progress Updates:  Tx focused on functional mobility training, gait with RW, and NMR via forced use, manual facilitation, and multi-modal cues. Pt highly-motivated and seems to have good safety awareness. However, he needs max verbal cue for minding RLE during mobility to prevent falls. of tx included recreation therapy for balance training. Pt needed up to Mod A for safely lifting or lowering during transfers throughout, cues for even weight bearing.   Gait training in controlled and room settings 2x150' and 3x25' with up to Mod A during turns, transitions and busy environments due to decreased R foot clearance. Pt's gait is marked by decreased R weight shift, trunk lean, decreased heel strike, and hip ext in stance. Pt able to modify ~75% x5 steps, but then returns to impaired gait.  Stair training x8 with bil rails, ascending alternating for strengthening. Pt able to verbalize safest sequence.   Nustep x12 min with bil LEs only for NMR and forced use on R side, with goal of 60 spm average.  Standing balance training and pre-gait including ball toss without UE support and step-overs with RLE only. Pt needed up to Mod to prevent posterior LOB.    Second tx focused on gait training and NMR with handouts for pt in room as well as Berg Balance test.  Gait in controlled setting 2x150' with RW and up to Mod A during turns as pt is unable to complete R gait cycle before body continues moving, especially during turns and multi-tasking. Pt with little carry-over from this morning. PT demonstrated full ext in stance and heel strike on R LE.  Berg balance test complete, scoring 23/56, indicating high falls risk. Pt was educated on findings and discussed home safety im[plications.   Pt eager for more therapy to pursue in room, so provided with LAQ, marching, and bridging for increased NMR 2x10 each bil.        Therapy Documentation Precautions:  Precautions Precautions: Fall Restrictions Weight Bearing Restrictions: No   Pain: Pain Assessment Pain Assessment: No/denies pain    Locomotion : Ambulation Ambulation/Gait Assistance: 3: Mod assist   See FIM for current functional status  Therapy/Group: Individual Therapy  Clydene Laming, PT, DPT   08/16/2014, 12:12 PM

## 2014-08-17 ENCOUNTER — Inpatient Hospital Stay (HOSPITAL_COMMUNITY): Payer: Medicare HMO | Admitting: Occupational Therapy

## 2014-08-17 ENCOUNTER — Inpatient Hospital Stay (HOSPITAL_COMMUNITY): Payer: Medicare HMO | Admitting: Physical Therapy

## 2014-08-17 ENCOUNTER — Inpatient Hospital Stay (HOSPITAL_COMMUNITY): Payer: Medicare HMO | Admitting: Rehabilitation

## 2014-08-17 LAB — GLUCOSE, CAPILLARY
GLUCOSE-CAPILLARY: 115 mg/dL — AB (ref 65–99)
Glucose-Capillary: 130 mg/dL — ABNORMAL HIGH (ref 65–99)
Glucose-Capillary: 191 mg/dL — ABNORMAL HIGH (ref 65–99)
Glucose-Capillary: 206 mg/dL — ABNORMAL HIGH (ref 65–99)

## 2014-08-17 MED ORDER — TERAZOSIN HCL 5 MG PO CAPS
10.0000 mg | ORAL_CAPSULE | Freq: Every day | ORAL | Status: DC
  Administered 2014-08-17 – 2014-08-24 (×8): 10 mg via ORAL
  Filled 2014-08-17 (×9): qty 2

## 2014-08-17 MED ORDER — GLIPIZIDE 10 MG PO TABS
5.0000 mg | ORAL_TABLET | Freq: Every day | ORAL | Status: DC
  Administered 2014-08-18 – 2014-08-25 (×8): 5 mg via ORAL
  Filled 2014-08-17 (×9): qty 1

## 2014-08-17 NOTE — Plan of Care (Signed)
Problem: RH Car Transfers Goal: LTG Patient will perform car transfers with assist (PT) LTG: Patient will perform car transfers with assistance (PT).  Downgraded 08/17/14  Problem: RH Ambulation Goal: LTG Patient will ambulate in controlled environment (PT) LTG: Patient will ambulate in a controlled environment, # of feet with assistance (PT).  Downgraded 08/17/14 Goal: LTG Patient will ambulate in home environment (PT) LTG: Patient will ambulate in home environment, # of feet with assistance (PT).  Downgraded 08/17/14 Goal: LTG Patient will ambulate in community environment (PT) LTG: Patient will ambulate in community environment, # of feet with assistance (PT).  Downgraded 08/17/14  Problem: RH Wheelchair Mobility Goal: LTG Patient will propel w/c in controlled environment (PT) LTG: Patient will propel wheelchair in controlled environment, # of feet with assist (PT)  Outcome: Not Applicable Date Met:  18/56/31 D/C 08/17/14 pt ambulatory Goal: LTG Patient will propel w/c in home environment (PT) LTG: Patient will propel wheelchair in home environment, # of feet with assistance (PT).  Outcome: Not Applicable Date Met:  49/70/26 D/C 08/17/14 due to pt ambulatory Goal: LTG Patient will propel w/c in community environment (PT) LTG: Patient will propel wheelchair in community environment, # of feet with assist (PT)  Outcome: Not Applicable Date Met:  37/85/88 D/C 08/17/14 due to pt ambulatory

## 2014-08-17 NOTE — Progress Notes (Signed)
Physical Therapy Session Note  Patient Details  Name: George Stevens MRN: [id number] Date of Birth: [birth year]  Today's Date: 08/17/2014 PT Individual Time: 0800-0858 PT Individual Time Calculation (min): 58 min   Short Term Goals: Week 1:  PT Short Term Goal 1 (Week 1): Pt will perform supine to edge of bed. edge of bed to supine with mod I PT Short Term Goal 2 (Week 1): Pt will transfer bed to chair, chair to bed with min A.  PT Short Term Goal 3 (Week 1): Pt will ambulate with rolling walker and min A about 150 feet.  PT Short Term Goal 4 (Week 1): Pt will propel w/c about 150 feet with S.  PT Short Term Goal 5 (Week 1): Pt will ascend/descend 12 stairs with 1 rail and min A.   Skilled Therapeutic Interventions/Progress Updates:   Pt received using restroom, nurse tech present with PT taking over.  Pt agreeable to therapy session following toileting.  Assisted with removal of condom cath per pt request.  Pt able to stand 3 reps with RW at min/guard level to adjust clothing prior to leaving room.  Ambulated to sink to wash dry hands.  Cues for safety and to attend to RLE during gait.  Ambulated x 150' to therapy gym with RW at min A level with facilitation at R hip for increased protraction as well as at chest for upright posture and forward translation over RLE during stance.  Once in therapy gym, addressed increased glute activation in supine with RLE only bridging off EOM x 2 sets of 10 reps.  Note pt unable to get to neutral but does have activation in hip.  Progressed to BLE bridging on mat with focus on moving B hips at same time for equal activation.  2 sets of 10 reps.  Continue to carryover this over in standing while using punching bag and standing aiming R hip for target.  Facilitation for decreased trunk rotation.  Note that PT donned R AFO lite for improved DF assist during stepping with marked improvement.  Ended session with trials of gait without AD with and without AFO x 90'  each in order to continue to challenge balance and increase WB through RLE during stance.  Note that pt does well with AFO and demonstrates improved R knee recurvatum as compared to when he does not wear brace.  Also note without brace, RLE fatigues quickly and tends to drag more.  Discussed different options with pt and will continue to assess bracing needs in future sessions.  Ambulated back to room as above.  Left with all needs in reach in recliner.    Therapy Documentation Precautions:  Precautions Precautions: Fall Restrictions Weight Bearing Restrictions: No   Vital Signs: Therapy Vitals Temp: 97.6 F (36.4 C) Temp Source: Oral Pulse Rate: 69 Resp: 16 BP: (!) 161/73 mmHg Patient Position (if appropriate): Lying Oxygen Therapy SpO2: 94 % O2 Device: Not Delivered Pain: Pt with no reports of pain during session.   See FIM for current functional status  Therapy/Group: Individual Therapy  Vista Deck 08/17/2014, 8:04 AM

## 2014-08-17 NOTE — Progress Notes (Signed)
Physical Therapy Session Note  Patient Details  Name: George Stevens MRN: [id number] Date of Birth: 07/26/1940  Today's Date: 08/17/2014 PT Individual Time: 1330-1500  PT Individual Time Calculation (min): 90 min    Short Term Goals: Week 1:  PT Short Term Goal 1 (Week 1): Pt will perform supine to edge of bed. edge of bed to supine with mod I PT Short Term Goal 2 (Week 1): Pt will transfer bed to chair, chair to bed with min A.  PT Short Term Goal 3 (Week 1): Pt will ambulate with rolling walker and min A about 150 feet.  PT Short Term Goal 4 (Week 1): Pt will propel w/c about 150 feet with S.  PT Short Term Goal 5 (Week 1): Pt will ascend/descend 12 stairs with 1 rail and min A.   Skilled Therapeutic Interventions/Progress Updates:    Gait Training: PT instructs pt in ambulation with RW x 150' req CGA/facilitation at R glute med & glute max to reduce uncompensated [place] gait. PT gives pt cues to try to stand erect and squeeze bottom cheeks together during R stance phase of gait. PT trials ambulation without AD x 75' + 150' req up to min A for balance with cues to pick up R foot to reduce foot drag during ambulation - intermittent R knee hyper extension noted with excessive glute max lurch in R stance without aD.  PT instructs pt in ascending/descending corner staircase req grossly min A: up with L leg on 6" height, down with R leg on 3 " height, up with R or L leg on 3" height, and down with R leg on 6" height - one rail used and significant glute max lurch in R stance noted.   Therapeutic Activity: PT instructs pt in quadruped/pre-falls recovery activity: bird-dog exercise with unilateral arm flexion x 10 reps alternating, progressing to unilateral leg extension x 4-5 reps alternating.  PT instructs pt in short to/from tall knee exercise with UE on low bench to facilitate steps in pre-falls recovery x 10 reps  Therapeutic Exercise: PT instructs pt in R LE strengthening  exercises: single R leg bridges, R clam shells, R side lie hip abduction, SLR, and hip adduction with theraball between knees in B hook lie isometric 5 second holds: 3 x 10 reps each  Pt works hard, but demonstrates DOE and req rest breaks. Pt's R arm demonstrates excellent strength; R leg is also relatively strong, but pt demonstrates poor motor control. Continue per PT POC.   Therapy Documentation Precautions:  Precautions Precautions: Fall Restrictions Weight Bearing Restrictions: No Pain: Pain Assessment Pain Assessment: No/denies pain  See FIM for current functional status  Therapy/Group: Individual Therapy  Charlea Nardo M 08/17/2014, 12:35 PM

## 2014-08-17 NOTE — Progress Notes (Signed)
Occupational Therapy Session Note  Patient Details  Name: George Stevens MRN: [id number] Date of Birth: [birth year]  Today's Date: 08/17/2014 OT Individual Time: 1000-1100 OT Individual Time Calculation (min): 60 min    Short Term Goals: Week 1:  OT Short Term Goal 1 (Week 1): Pt.will be mod I with LB bathing using AE prn OT Short Term Goal 2 (Week 1): Pt will be mod I with LB dressing using AE PRN OT Short Term Goal 3 (Week 1): Pt will transfer to toilet with RW at mod I OT Short Term Goal 4 (Week 1): Pt will perform 3/3 toileting items at mod I OT Short Term Goal 5 (Week 1): Pt. will SBA with dynamic standing balance in shower stall  Skilled Therapeutic Interventions/Progress Updates:  Upon entering the room, pt seated in recliner chair with no c/o pain. Pt declined bathing and dressing this session. Pt ambulating to ADL apartment 120' with use of RW and min guard. Pt engaged in bed making with use of RW and having 2 LOB requiring min - mod A to correct. Pt also requiring min guard throughout task and min verbal cues and assist to stay within RW and advance RW as appropriate for safety. Pt seated in chair at table for R fine motor coordination activities with use of red, medium soft theraputty. Pt returning demonstrations with min verbal cues for proper technique. Pt having increased difficulty and dragging foot when ambulating on carpet surface and with fatigue. Pt requiring increased verbal cues for safety to pick up R foot with ambulating. Pt seated in wheelchair with call bell and all needed items within reach upon exiting the room.   Therapy Documentation Precautions:  Precautions Precautions: Fall Restrictions Weight Bearing Restrictions: No  See FIM for current functional status  Therapy/Group: Individual Therapy  Lowella Grip 08/17/2014, 11:34 AM

## 2014-08-17 NOTE — Progress Notes (Signed)
Social Work Patient ID: George Stevens, male   DOB: November 09, 1940, 74 y.o.   MRN: [id number] Met with pt to inform team conference goals-supervision and discharge 8/20. Pt is disappointed due to wanting to discharge by this weekend. He requires physical care currently and would not be at a level to return home with wife this weekend. Will ask wife to come in and attend therapies With pt to see his progress and current level. Pt not being realistic regarding his progress and the length of time it will take to achieve his goals. Aware if he progresses more Quickly team could move discharge date up.

## 2014-08-17 NOTE — Patient Care Conference (Signed)
Inpatient RehabilitationTeam Conference and Plan of Care Update Date: 08/17/2014   Time: 11;00 AM    Patient Name: George Stevens      Medical Record Number: [id number]  Date of Birth: [birth year] Sex: Male         Room/Bed: 4M09C/4M09C-01 Payor Info: Payor: [employer] MEDICARE / Plan: [employer] MEDICARE HMO/PPO / Product Type: *No Product type* /    Admitting Diagnosis: L CVA  Admit Date/Time:  08/12/2014  4:38 PM Admission Comments: No comment available   Primary Diagnosis:  <principal problem not specified> Principal Problem: <principal problem not specified>  Patient Active Problem List   Diagnosis Date Noted  . Right hemiparesis 08/12/2014  . 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 Diabetes type 2, uncontrolled   . Cellulitis of right upper extremity   . Essential hypertension   . Primary gout   . Basal ganglia infarction 08/09/2014    Expected Discharge Date: Expected Discharge Date: 08/27/14  Team Members Present: Physician leading conference: Dr. Claudette Laws Social Worker Present: Dossie Der, LCSW Nurse Present: Carmie End, RN PT Present: Edman Circle, PT;Other (comment) (lizzie Tygielski-PT) OT Present: Roney Mans, Suszanne Conners, OT SLP Present: Jackalyn Lombard, SLP PPS Coordinator present : Tora Duck, RN, CRRN     Current Status/Progress Goal Weekly Team Focus  Medical   Right hemiparesis, DM control improving  home at mod I level with [facility] PT/OT  manage CBGs, increase balance   Bowel/Bladder   Continent to bowel and bladder.  To continue continent to B & B with min. assisst.  To monitor bladder and bowel function Q shift.   Swallow/Nutrition/ Hydration     na        ADL's   LB dressing/bathing-min A; UB dressing-supervision; shower transfers/toilet transfers-min A; dynamic standing balance-min A  mod I overall, shower transfers-supervision  functional amb with RW for home mgmt tasks, activity tolerane, RUE strength and FMC, standing balance, education   Mobility   S bed mobility, Min/mod A for transfers, up to Mod A for gait with RW x150', Up to Mod A 8 stairs with 2 rails, 23/56 Berg and 13.25 5x STS  Mod I all bed mobility, transfers, and gait x150', S flight stairs with 1 rail  RLE NMR, balance training, gait training, activity tolerance, safety awareness   Communication     na        Safety/Cognition/ Behavioral Observations    unsafe behaviors        Pain   No complain of pain.  Keep pain levels less than 3,on scale 1 to 10.  To monitor for pain levels Q 2-3 hrs. and PRN.   Skin   Skin dry and intact.  To keep skin free of pressure ulcers.  To monitor skin Q shift. and PRN      *See Care Plan and progress notes for long and short-term goals.  Barriers to Discharge: wife unable to provide physical assist     Possible Resolutions to Barriers:  cont rehab as above    Discharge Planning/Teaching Needs:  Home with wife who can provide 24 hr light min assist if necessary-making good progress here      Team Discussion:  Goals-supervision have been downgraded from mod/i level. Due to activity tolerance, safety and cueing for balance issues. Will need family education with wife prior to discharge. Adjusting medicines with AFO consult  Revisions to Treatment Plan:  Downgraded goals-supervision level   Continued Need for Acute Rehabilitation Level of Care: The patient requires daily  medical management by a physician with specialized training in physical medicine and rehabilitation for the following conditions: Daily direction of a multidisciplinary physical rehabilitation program to ensure safe treatment while eliciting the highest outcome that is of practical value to the patient.: Yes Daily medical management of patient stability for increased activity during participation in an intensive rehabilitation regime.: Yes Daily analysis of laboratory values and/or radiology reports with any subsequent need for medication adjustment of medical  intervention for : Neurological problems;Other  Alexee Delsanto, Lemar Livings 08/18/2014, 1:33 PM

## 2014-08-17 NOTE — Progress Notes (Signed)
Orthopedic Tech Progress Note Patient Details:  George Stevens [birth year] [id number]  Patient ID: George Stevens, male   DOB: 10-08-1940, 74 y.o.   MRN: [id number] Called in advanced brace order; spoke with Ferdinand Lango, George Stevens 08/17/2014, 11:51 AM

## 2014-08-17 NOTE — Progress Notes (Signed)
Subjective/Complaints:  No issues overnite Discussed diabetic med management  ROS- neg CP, SOB, Abd pain, appetite ok  Objective: Vital Signs: Blood pressure 161/73, pulse 69, temperature 97.6 F (36.4 C), temperature source Oral, resp. rate 16, height 5' 5"  (1.651 m), weight 84.9 kg (187 lb 2.7 oz), SpO2 94 %. No results found. Results for orders placed or performed during the hospital encounter of 08/12/14 (from the past 72 hour(s))  Glucose, capillary     Status: Abnormal   Collection Time: 08/14/14  7:13 AM  Result Value Ref Range   Glucose-Capillary 215 (H) 65 - 99 mg/dL  Basic metabolic panel     Status: Abnormal   Collection Time: 08/14/14  8:28 AM  Result Value Ref Range   Sodium 136 135 - 145 mmol/L   Potassium 4.0 3.5 - 5.1 mmol/L   Chloride 102 101 - 111 mmol/L   CO2 24 22 - 32 mmol/L   Glucose, Bld 244 (H) 65 - 99 mg/dL   BUN 28 (H) 6 - 20 mg/dL   Creatinine, Ser 1.63 (H) 0.61 - 1.24 mg/dL   Calcium 9.1 8.9 - 10.3 mg/dL   GFR calc non Af Amer 40 (L) >60 mL/min   GFR calc Af Amer 46 (L) >60 mL/min    Comment: (NOTE) The eGFR has been calculated using the CKD EPI equation. This calculation has not been validated in all clinical situations. eGFR's persistently <60 mL/min signify possible Chronic Kidney Disease.    Anion gap 10 5 - 15  Glucose, capillary     Status: Abnormal   Collection Time: 08/14/14 11:37 AM  Result Value Ref Range   Glucose-Capillary 225 (H) 65 - 99 mg/dL   Comment 1 Notify RN   Glucose, capillary     Status: Abnormal   Collection Time: 08/14/14  2:02 PM  Result Value Ref Range   Glucose-Capillary 217 (H) 65 - 99 mg/dL   Comment 1 Document in Chart   Glucose, capillary     Status: Abnormal   Collection Time: 08/14/14  5:41 PM  Result Value Ref Range   Glucose-Capillary 303 (H) 65 - 99 mg/dL   Comment 1 Notify RN   Glucose, capillary     Status: Abnormal   Collection Time: 08/14/14  8:48 PM  Result Value Ref Range   Glucose-Capillary 223 (H) 65 - 99 mg/dL   Comment 1 Notify RN   Glucose, capillary     Status: Abnormal   Collection Time: 08/15/14  7:07 AM  Result Value Ref Range   Glucose-Capillary 120 (H) 65 - 99 mg/dL   Comment 1 Notify RN   Glucose, capillary     Status: Abnormal   Collection Time: 08/15/14 11:33 AM  Result Value Ref Range   Glucose-Capillary 213 (H) 65 - 99 mg/dL  Glucose, capillary     Status: Abnormal   Collection Time: 08/15/14  4:44 PM  Result Value Ref Range   Glucose-Capillary 328 (H) 65 - 99 mg/dL  Glucose, capillary     Status: Abnormal   Collection Time: 08/15/14  8:49 PM  Result Value Ref Range   Glucose-Capillary 250 (H) 65 - 99 mg/dL   Comment 1 Notify RN   Glucose, capillary     Status: Abnormal   Collection Time: 08/16/14  7:19 AM  Result Value Ref Range   Glucose-Capillary 207 (H) 65 - 99 mg/dL   Comment 1 Notify RN   Glucose, capillary     Status: Abnormal   Collection  Time: 08/16/14 12:09 PM  Result Value Ref Range   Glucose-Capillary 190 (H) 65 - 99 mg/dL   Comment 1 Notify RN   Glucose, capillary     Status: Abnormal   Collection Time: 08/16/14  5:05 PM  Result Value Ref Range   Glucose-Capillary 115 (H) 65 - 99 mg/dL   Comment 1 Notify RN   Glucose, capillary     Status: Abnormal   Collection Time: 08/16/14  8:49 PM  Result Value Ref Range   Glucose-Capillary 247 (H) 65 - 99 mg/dL  Glucose, capillary     Status: Abnormal   Collection Time: 08/17/14  6:35 AM  Result Value Ref Range   Glucose-Capillary 130 (H) 65 - 99 mg/dL     HEENT: normal Cardio: RRR and no murmur Resp: CTA B/L and unlabored GI: BS positive and NT, ND Extremity:  Pulses positive and No Edema Skin:   Intact Neuro: Alert/Oriented, Normal Sensory, Abnormal Motor 4/5 on RIght side , 5/5 on Left and Abnormal FMC Ataxic/ dec FMC Musc/Skel:  Other minimal Left lateral knee tenderness Gen NAD   Assessment/Plan: 1. 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 Physiatrist is providing close team supervision and 24 hour management of active medical problems listed below. Physiatrist and rehab team continue to assess barriers to discharge/monitor patient progress toward functional and medical goals. FIM: FIM - Bathing Bathing Steps Patient Completed: Chest, Right Arm, Left Arm, Abdomen, Front perineal area, Buttocks, Right upper leg, Left upper leg Bathing: 4: Min-Patient completes 8-9 69f10 parts or 75+ percent  FIM - Upper Body Dressing/Undressing Upper body dressing/undressing steps patient completed: Thread/unthread right sleeve of pullover shirt/dresss, Thread/unthread left sleeve of pullover shirt/dress, Put head through opening of pull over shirt/dress, Pull shirt over trunk Upper body dressing/undressing: 5: Supervision: Safety issues/verbal cues FIM - Lower Body Dressing/Undressing Lower body dressing/undressing steps patient completed: Thread/unthread right pants leg, Thread/unthread left pants leg, Pull pants up/down, Don/Doff left sock, Don/Doff left shoe Lower body dressing/undressing: 4: Min-Patient completed 75 plus % of tasks  FIM - Toileting Toileting steps completed by patient: Adjust clothing after toileting, Performs perineal hygiene, Adjust clothing prior to toileting Toileting: 4: Steadying assist  FIM - TRadio producerDevices: Grab bars Toilet Transfers: 4-To toilet/BSC: Min A (steadying Pt. > 75%), 4-From toilet/BSC: Min A (steadying Pt. > 75%)  FIM - Bed/Chair Transfer Bed/Chair Transfer Assistive Devices: Walker, Arm rests Bed/Chair Transfer: 5: Bed > Chair or W/C: Supervision (verbal cues/safety issues), 5: Chair or W/C > Bed: Supervision (verbal cues/safety issues)  FIM - Locomotion: Wheelchair Distance: 125 Locomotion: Wheelchair: 0: Activity  did not occur FIM - Locomotion: Ambulation Locomotion: Ambulation Assistive Devices: WAdministratorAmbulation/Gait Assistance: 4: Min assist Locomotion: Ambulation: 4: Travels 150 ft or more with minimal assistance (Pt.>75%)  Comprehension Comprehension Mode: Auditory Comprehension: 6-Follows complex conversation/direction: With extra time/assistive device  Expression Expression Mode: Verbal Expression: 6-Expresses complex ideas: With extra time/assistive device  Social Interaction Social Interaction: 7-Interacts appropriately with others - No medications needed.  Problem Solving Problem Solving: 6-Solves complex problems: With extra time  Memory Memory: 6-More than reasonable amt of time  Medical Problem List and Plan: 1. Functional deficits secondary to left basal ganglia/corona radiata infarct 2. DVT Prophylaxis/Anticoagulation: Pharmaceutical: Lovenox--indicated due to immobility, Plavix for CVA 3. Pain Management: N/A.  4. Mood: Team to provide ego support.  LCSW to follow for evaluation and support.  5. Neuropsych: This patient is capable of making decisions on his own behalf. 6. Skin/Wound Care: Routine pressure relief measures. Maintain adequate fluid and nutritional intake.  7. Fluids/Electrolytes/Nutrition: Monitor I/O. Check lytes in am. 100% meals 8. DM type 2: Poorly controlled due to dietary non-compliance. Will get RD to help with diet education. Will monitor BS with ac/hs CBG checks. Increase levemir at bedtime with SSI for tighter blood sugar control. Continue to hold metformin due to renal insufficiency CKD. Add glucotrol Qam for daytime CBG, increase to 52m in am 9. HTN: Monitor BP every 8 hours. Continue losartan (max dose), coreg (max dose)and hytrin . Titrate medications , increase hytrin to 160m am elevated systolics , HR in a good range   11. CKD?: Baseline BUN/Cr- 22/1.58. 12. Dyslipidemia: continue Crestor.  13. Low protein stores: Will add  prosource supplements.    LOS (Days) 5 A FACE TO FACE EVALUATION WAS PERFORMED  KIRSTEINS,ANDREW E 08/17/2014, 6:58 AM   -r

## 2014-08-18 ENCOUNTER — Inpatient Hospital Stay (HOSPITAL_COMMUNITY): Payer: Medicare HMO

## 2014-08-18 ENCOUNTER — Inpatient Hospital Stay (HOSPITAL_COMMUNITY): Payer: Medicare HMO | Admitting: Physical Therapy

## 2014-08-18 LAB — GLUCOSE, CAPILLARY
GLUCOSE-CAPILLARY: 95 mg/dL (ref 65–99)
GLUCOSE-CAPILLARY: 182 mg/dL — AB (ref 65–99)
Glucose-Capillary: 100 mg/dL — ABNORMAL HIGH (ref 65–99)
Glucose-Capillary: 110 mg/dL — ABNORMAL HIGH (ref 65–99)

## 2014-08-18 LAB — URINALYSIS, ROUTINE W REFLEX MICROSCOPIC
Bilirubin Urine: NEGATIVE
GLUCOSE, UA: 250 mg/dL — AB
Ketones, ur: NEGATIVE mg/dL
LEUKOCYTES UA: NEGATIVE
Nitrite: NEGATIVE
PROTEIN: 100 mg/dL — AB
Specific Gravity, Urine: 1.02 (ref 1.005–1.030)
UROBILINOGEN UA: 0.2 mg/dL (ref 0.0–1.0)
pH: 5 (ref 5.0–8.0)

## 2014-08-18 LAB — URINE MICROSCOPIC-ADD ON

## 2014-08-18 MED ORDER — HYDROCHLOROTHIAZIDE 25 MG PO TABS: 25.0000 mg | ORAL_TABLET | Freq: Every day | ORAL | Status: DC

## 2014-08-18 MED ORDER — LIDOCAINE HCL 2 % EX GEL
CUTANEOUS | Status: DC | PRN
  Filled 2014-08-18: qty 5

## 2014-08-18 MED ORDER — LOSARTAN POTASSIUM 50 MG PO TABS
100.0000 mg | ORAL_TABLET | Freq: Every day | ORAL | Status: DC
  Administered 2014-08-19 – 2014-08-25 (×7): 100 mg via ORAL
  Filled 2014-08-18 (×7): qty 2

## 2014-08-18 MED ORDER — HYDROCHLOROTHIAZIDE 25 MG PO TABS
25.0000 mg | ORAL_TABLET | Freq: Every day | ORAL | Status: DC
  Administered 2014-08-19 – 2014-08-24 (×6): 25 mg via ORAL
  Filled 2014-08-18 (×6): qty 1

## 2014-08-18 MED ORDER — LOSARTAN POTASSIUM-HCTZ 100-25 MG PO TABS: 1.0000 | ORAL_TABLET | Freq: Every day | ORAL | Status: DC

## 2014-08-18 MED ORDER — INSULIN ASPART 100 UNIT/ML ~~LOC~~ SOLN
10.0000 [IU] | Freq: Three times a day (TID) | SUBCUTANEOUS | Status: DC
  Administered 2014-08-18 – 2014-08-19 (×5): 10 [IU] via SUBCUTANEOUS
  Administered 2014-08-20: 9 [IU] via SUBCUTANEOUS
  Administered 2014-08-20: 10 [IU] via SUBCUTANEOUS

## 2014-08-18 NOTE — Progress Notes (Signed)
Physical Therapy Session Note  Patient Details  Name: George Stevens MRN: [id number] Date of Birth: April 10, 1940  Today's Date: 08/18/2014 PT Individual Time: 1000-1030 and 1400-1500  PT Individual Time Calculation (min): 30 min and 60 min (total 90 min)  Short Term Goals: Week 1:  PT Short Term Goal 1 (Week 1): Pt will perform supine to edge of bed. edge of bed to supine with mod I PT Short Term Goal 2 (Week 1): Pt will transfer bed to chair, chair to bed with min A.  PT Short Term Goal 3 (Week 1): Pt will ambulate with rolling walker and min A about 150 feet.  PT Short Term Goal 4 (Week 1): Pt will propel w/c about 150 feet with S.  PT Short Term Goal 5 (Week 1): Pt will ascend/descend 12 stairs with 1 rail and min A.   Skilled Therapeutic Interventions/Progress Updates:    1000-1030: Pt received seated in chair with PA present; no c/o pain and agreeable to treatment. Pt expresses frustration with being in the hospital, wanting to go home as soon as possible, and at the same time stating he is realizing he is unable to do certain things like he thinks he can. Discussed with pt his current limitations and the goal of d/c home safely, while reaping all benefits of intense rehab while he is here. Pt agreeable and states he understands, he is just frustrated. Session focused on balance and LE strengthening due to pt request stating those are his biggest impairments. Pt given option of working on his choice of activity to increase compliance and engagement in session. Progressively challenging static balance tasks including narrow BOS, semi-tandem, and tandem stance all for 2 reps of 30 seconds, eyes open and eyes closed. Educated pt on role of sensory organization and the added challenge of removing visual input to further challenge brain/body. Pt has increasing difficulty with each stance, with RUE reaching for support on table during semi tandem and tandem. 2 sets 10 reps of sit <>stand with UEs on  R knee to facilitate RLE weight bearing, seated rest break between trials. Gait trial x150' with RW and min guard with orthotist present for evaluation for AFO. Pt remained seated in chair with orthotist present and all needs within reach at completion of session.   1400-1500: Pt received in w/c with no c/o pain and agreeable to treatment. W/c propulsion from room to therapy gym x150' with supervision and BUEs. Stair training x2 trials for 12 stairs each trial with alternating lead LE during ascent for RLE strengthening. Noted 3 instances during descent of RLE hyperextension, all happening on last step down to floor. Unable to determine if lack of attention to knee or due to change in body position with hand rails behind body at last step. Pt fatigued after each trial requiring seated rest break. Gait training for 3 trials of approximately 39' each with min guard. Initial trial with RW and no AFO; noted R toe drag which increased with turning to return to chair and prior to sitting. Second trial with RW and posterior leaf spring; improved foot clearance and safety with turning however pt reports discomfort on medial ankle. Third trial performed with AFO and no AD; noted inc unsteadiness and variable step length/width, requiring variable min/modA to maintain balance, however with noted improved foot clearance. LE strengthening exercises performed for 2 sets 10 reps each; straight leg raise, single leg bridging, hip extension with LEs on stability ball, stability ball hip extension plus hamstring  curl. Pt educated in motor control of R hamstrings and quads for knee stability during stance. Also discussed use of AFO for improved safety and foot clearance and that use may be temporary until he builds strength and motor control. Pt returned to room with totalA due to time constraints; remained seated with all needs within reach at completion of session. Informed pt on use of call bell system prior to getting up and pt  agreeable.    Therapy Documentation Precautions:  Precautions Precautions: Fall Restrictions Weight Bearing Restrictions: No Pain: Pain Assessment Pain Assessment: No/denies pain Pain Score: 0-No pain Locomotion : Ambulation Ambulation/Gait Assistance: 4: Min guard   See FIM for current functional status  Therapy/Group: Individual Therapy  Vista Lawman 08/18/2014, 12:13 PM

## 2014-08-18 NOTE — Progress Notes (Signed)
Orthopedic Tech Progress Note Patient Details:  George Stevens September 28, 1940 [id number]  Patient ID: George Stevens, male   DOB: May 31, 1940, 74 y.o.   MRN: [id number] Called in advanced brace order; spoke with Tonny Bollman, Alyla Pietila 08/18/2014, 3:07 PM

## 2014-08-18 NOTE — Progress Notes (Signed)
Occupational Therapy Session Note  Patient Details  Name: George Stevens MRN: [id number] Date of Birth: 1940-08-03  Today's Date: 08/18/2014 OT Individual Time: 1300-1345 OT Individual Time Calculation (min): 45 min    Short Term Goals: Week 1:  OT Short Term Goal 1 (Week 1): Pt.will be mod I with LB bathing using AE prn OT Short Term Goal 2 (Week 1): Pt will be mod I with LB dressing using AE PRN OT Short Term Goal 3 (Week 1): Pt will transfer to toilet with RW at mod I OT Short Term Goal 4 (Week 1): Pt will perform 3/3 toileting items at mod I OT Short Term Goal 5 (Week 1): Pt. will SBA with dynamic standing balance in shower stall  Skilled Therapeutic Interventions/Progress Updates: Therapeutic exercise with focus on improved endurance, general strengthening, and functional mobility .  Pt performed NuStep conditioning routine, 15 min, level 2 with BUE and LE for 10 min, level 4 just LE for 5 min.   Pt required 1 rest break after 10 min.   HR within limits however BP climbed to 166/73 at end of session requiring additional rest break for recovery d/t report of fatigue.   Pt then ambulated to ADL apartment and was instructed on fall recovery method.   After observing demonstration by therapist, pt completed recovery from mat on floor to love seat with mod assist to lift and place right leg and intermittent vc for technique.   Pt reports plan to carry his cell phone with him when outside however he admits to prior habit of leaving phone in his car just for emergency use.  Pt states that his family is already reinforcing his need to establish new habit of carrying his phone with him as advised.     Therapy Documentation Precautions:  Precautions Precautions: Fall Restrictions Weight Bearing Restrictions: No  Pain: Pain Assessment Pain Assessment: No/denies pain Pain Score: 0-No pain  See FIM for current functional status  Therapy/Group: Individual  Therapy  Zephaniah Enyeart 08/18/2014, 3:00 PM

## 2014-08-18 NOTE — Progress Notes (Signed)
Subjective/Complaints: Tried sleeping without condom cath, had difficulty managing urinal wet on himself Has freq of urination but no burning Problems occurred prior to CVA as well  ROS- neg CP, SOB, Abd pain, appetite ok  Objective: Vital Signs: Blood pressure 165/76, pulse 77, temperature 98 F (36.7 C), temperature source Oral, resp. rate 18, height  (1.651 m), weight 89.177 kg (196 lb 9.6 oz), SpO2 98 %. No results found. Results for orders placed or performed during the hospital encounter of 08/12/14 (from the past 72 hour(s))  Glucose, capillary     Status: Abnormal   Collection Time: 08/15/14 11:33 AM  Result Value Ref Range   Glucose-Capillary 213 (H) 65 - 99 mg/dL  Glucose, capillary     Status: Abnormal   Collection Time: 08/15/14  4:44 PM  Result Value Ref Range   Glucose-Capillary 328 (H) 65 - 99 mg/dL  Glucose, capillary     Status: Abnormal   Collection Time: 08/15/14  8:49 PM  Result Value Ref Range   Glucose-Capillary 250 (H) 65 - 99 mg/dL   Comment 1 Notify RN   Glucose, capillary     Status: Abnormal   Collection Time: 08/16/14  7:19 AM  Result Value Ref Range   Glucose-Capillary 207 (H) 65 - 99 mg/dL   Comment 1 Notify RN   Glucose, capillary     Status: Abnormal   Collection Time: 08/16/14 12:09 PM  Result Value Ref Range   Glucose-Capillary 190 (H) 65 - 99 mg/dL   Comment 1 Notify RN   Glucose, capillary     Status: Abnormal   Collection Time: 08/16/14  5:05 PM  Result Value Ref Range   Glucose-Capillary 115 (H) 65 - 99 mg/dL   Comment 1 Notify RN   Glucose, capillary     Status: Abnormal   Collection Time: 08/16/14  8:49 PM  Result Value Ref Range   Glucose-Capillary 247 (H) 65 - 99 mg/dL  Glucose, capillary     Status: Abnormal   Collection Time: 08/17/14  6:35 AM  Result Value Ref Range   Glucose-Capillary 130 (H) 65 - 99 mg/dL  Glucose, capillary     Status: Abnormal   Collection Time: 08/17/14 11:17 AM  Result Value Ref Range   Glucose-Capillary 115 (H) 65 - 99 mg/dL   Comment 1 Notify RN   Glucose, capillary     Status: Abnormal   Collection Time: 08/17/14  4:28 PM  Result Value Ref Range   Glucose-Capillary 191 (H) 65 - 99 mg/dL   Comment 1 Notify RN   Glucose, capillary     Status: Abnormal   Collection Time: 08/17/14  8:22 PM  Result Value Ref Range   Glucose-Capillary 206 (H) 65 - 99 mg/dL  Glucose, capillary     Status: Abnormal   Collection Time: 08/18/14  6:49 AM  Result Value Ref Range   Glucose-Capillary 100 (H) 65 - 99 mg/dL     HEENT: normal Cardio: RRR and no murmur Resp: CTA B/L and unlabored GI: BS positive and NT, ND Extremity:  Pulses positive and No Edema Skin:   Intact Neuro: Alert/Oriented, Normal Sensory, Abnormal Motor 4/5 on RIght side , 5/5 on Left and Abnormal FMC Ataxic/ dec FMC Musc/Skel:  Other minimal Left lateral knee tenderness Gen NAD   Assessment/Plan: 1. 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 Physiatrist is providing  close team supervision and 24 hour management of active medical problems listed below. Physiatrist and rehab team continue to assess barriers to discharge/monitor patient progress toward functional and medical goals. FIM: FIM - Bathing Bathing Steps Patient Completed: Chest, Right Arm, Left Arm, Abdomen, Front perineal area, Buttocks, Right upper leg, Left upper leg Bathing: 4: Min-Patient completes 8-9 34f 10 parts or 75+ percent  FIM - Upper Body Dressing/Undressing Upper body dressing/undressing steps patient completed: Thread/unthread right sleeve of pullover shirt/dresss, Thread/unthread left sleeve of pullover shirt/dress, Put head through opening of pull over shirt/dress, Pull shirt over trunk Upper body dressing/undressing: 5: Supervision: Safety issues/verbal cues FIM - Lower Body  Dressing/Undressing Lower body dressing/undressing steps patient completed: Thread/unthread right pants leg, Thread/unthread left pants leg, Pull pants up/down, Don/Doff left sock, Don/Doff left shoe Lower body dressing/undressing: 4: Min-Patient completed 75 plus % of tasks  FIM - Toileting Toileting steps completed by patient: Adjust clothing after toileting, Performs perineal hygiene, Adjust clothing prior to toileting Toileting: 4: Steadying assist  FIM - Diplomatic Services operational officer Devices: Grab bars Toilet Transfers: 4-To toilet/BSC: Min A (steadying Pt. > 75%), 4-From toilet/BSC: Min A (steadying Pt. > 75%)  FIM - Banker Devices: Arm rests, Therapist, occupational: 4: Chair or W/C > Bed: Min A (steadying Pt. > 75%), 5: Supine > Sit: Supervision (verbal cues/safety issues), 5: Sit > Supine: Supervision (verbal cues/safety issues)  FIM - Locomotion: Wheelchair Distance: 125 Locomotion: Wheelchair: 0: Activity did not occur FIM - Locomotion: Ambulation Locomotion: Ambulation Assistive Devices: Other (comment), Walker - Rolling (and without AD) Ambulation/Gait Assistance: 4: Min guard, 4: Min assist Locomotion: Ambulation: 4: Travels 150 ft or more with minimal assistance (Pt.>75%)  Comprehension Comprehension Mode: Auditory Comprehension: 6-Follows complex conversation/direction: With extra time/assistive device  Expression Expression Mode: Verbal Expression: 6-Expresses complex ideas: With extra time/assistive device  Social Interaction Social Interaction: 7-Interacts appropriately with others - No medications needed.  Problem Solving Problem Solving: 6-Solves complex problems: With extra time  Memory Memory: 6-More than reasonable amt of time  Medical Problem List and Plan: 1. Functional deficits secondary to left basal ganglia/corona radiata infarct 2. DVT Prophylaxis/Anticoagulation: Pharmaceutical:  Lovenox--indicated due to immobility, Plavix for CVA 3. Pain Management: N/A.  4. Mood: Team to provide ego support. LCSW to follow for evaluation and support.  5. Neuropsych: This patient is capable of making decisions on his own behalf. 6. Skin/Wound Care: Routine pressure relief measures. Maintain adequate fluid and nutritional intake.  7. Fluids/Electrolytes/Nutrition: Monitor I/O. Check lytes in am. 100% meals 8. DM type 2: Poorly controlled due to dietary non-compliance. Will get RD to help with diet education. Will monitor BS with ac/hs CBG checks. Increase levemir at bedtime with SSI for tighter blood sugar control. Continue to hold metformin due to renal insufficiency CKD. Add glucotrol Qam for daytime CBG, increase to 5mg  in am, improving CBGs 9. HTN: Monitor BP every 8 hours. Continue losartan (max dose), coreg (max dose)and hytrin . Titrate medications , increase hytrin to 10mg . am elevated systolics , HR in a good range   11. CKD?: Baseline BUN/Cr- 22/1.58.Will repeat 12. Dyslipidemia: continue Crestor. No side effects 13. Low protein stores: Will add prosource supplements.  14.  Urinary freq occurred prior to CVA, on hytrin already, check PVR and UA  LOS (Days) 6 A FACE TO FACE EVALUATION WAS PERFORMED  George Stevens E 08/18/2014, 7:41 AM   -r

## 2014-08-18 NOTE — Progress Notes (Signed)
Social Work Patient ID: George Stevens, male   DOB: 1941/01/03, 74 y.o.   MRN: [id number] Spoke with wife via telephone to discuss team conference goals-supervision level and discharge 8/20. She feels she can do the care he needs at home due to provided care to her Mom and  Another family member. Discussed having her come in and going through therapies, so team feels comfortable and if medically stable could then move up discharge date. She needs to work for their Son today and tomorrow but could come in Monday and attend therapies with him. Wife feels pt would do better at home and she has no concerns rprviding 24 hr care.  She also has their duaghter in-law Home to assist her.  Will let team know of possible plan.

## 2014-08-18 NOTE — Progress Notes (Signed)
Occupational Therapy Session Note  Patient Details  Name: George Stevens MRN: [id number] Date of Birth: 01-15-1940  Today's Date: 08/18/2014 OT Individual Time: 1100-1200 OT Individual Time Calculation (min): 60 min    Short Term Goals: Week 1:  OT Short Term Goal 1 (Week 1): Pt.will be mod I with LB bathing using AE prn OT Short Term Goal 2 (Week 1): Pt will be mod I with LB dressing using AE PRN OT Short Term Goal 3 (Week 1): Pt will transfer to toilet with RW at mod I OT Short Term Goal 4 (Week 1): Pt will perform 3/3 toileting items at mod I OT Short Term Goal 5 (Week 1): Pt. will SBA with dynamic standing balance in shower stall  Skilled Therapeutic Interventions/Progress Updates:    Pt initially engaged in BADL retraining including bathing at shower level and dressing with sit<>stand from recliner in room.  Pt amb with RW to bathroom and completed bathing tasks with sit<>stand from shower seat.  Pt performed dressing from recliner.  Pt required assistance donning socks.  Pt stated he had trouble donning socks prior to hospitalization.  Pt attempted use of sock aide but his foot is too wide for standard sock aide.  Pt amb with RW to therapy gym and engaged in dynamic standing tasks on compliant and noncompliant surfaces.  Pt was able to perform reaching and tossing tasks without UE support to complete activities.  Focus on activity tolerance, increased RUE use, functional amb with RW, dynamic standing balance, and safety awareness.  Therapy Documentation Precautions:  Precautions Precautions: Fall Restrictions Weight Bearing Restrictions: No  Pain: Pain Assessment Pain Assessment: No/denies pain Pain Score: 0-No pain  See FIM for current functional status  Therapy/Group: Individual Therapy  Rich Brave 08/18/2014, 12:15 PM

## 2014-08-19 ENCOUNTER — Inpatient Hospital Stay (HOSPITAL_COMMUNITY): Payer: Medicare HMO | Admitting: Physical Therapy

## 2014-08-19 ENCOUNTER — Inpatient Hospital Stay (HOSPITAL_COMMUNITY): Payer: Medicare HMO | Admitting: Occupational Therapy

## 2014-08-19 LAB — CBC
HEMATOCRIT: 38.1 % — AB (ref 39.0–52.0)
Hemoglobin: 12.9 g/dL — ABNORMAL LOW (ref 13.0–17.0)
MCH: 28.5 pg (ref 26.0–34.0)
MCHC: 33.9 g/dL (ref 30.0–36.0)
MCV: 84.1 fL (ref 78.0–100.0)
PLATELETS: 250 10*3/uL (ref 150–400)
RBC: 4.53 MIL/uL (ref 4.22–5.81)
RDW: 12.7 % (ref 11.5–15.5)
WBC: 7.1 10*3/uL (ref 4.0–10.5)

## 2014-08-19 LAB — GLUCOSE, CAPILLARY
GLUCOSE-CAPILLARY: 156 mg/dL — AB (ref 65–99)
GLUCOSE-CAPILLARY: 197 mg/dL — AB (ref 65–99)
Glucose-Capillary: 147 mg/dL — ABNORMAL HIGH (ref 65–99)
Glucose-Capillary: 54 mg/dL — ABNORMAL LOW (ref 65–99)
Glucose-Capillary: 180 mg/dL — ABNORMAL HIGH (ref 65–99)

## 2014-08-19 LAB — URINE CULTURE: Culture: NO GROWTH

## 2014-08-19 MED ORDER — GLUCOSE 40 % PO GEL
ORAL | Status: AC
  Administered 2014-08-19: 13:00:00
  Filled 2014-08-19: qty 1

## 2014-08-19 NOTE — Progress Notes (Signed)
Physical Therapy Session Note  Patient Details  Name: George Stevens MRN: [id number] Date of Birth: [birth year]  Today's Date: 08/19/2014 PT Individual Time: 1000-1100 PT Individual Time Calculation (min): 60 min   Short Term Goals: Week 1:  PT Short Term Goal 1 (Week 1): Pt will perform supine to edge of bed. edge of bed to supine with mod I PT Short Term Goal 2 (Week 1): Pt will transfer bed to chair, chair to bed with min A.  PT Short Term Goal 3 (Week 1): Pt will ambulate with rolling walker and min A about 150 feet.  PT Short Term Goal 4 (Week 1): Pt will propel w/c about 150 feet with S.  PT Short Term Goal 5 (Week 1): Pt will ascend/descend 12 stairs with 1 rail and min A.   Skilled Therapeutic Interventions/Progress Updates:  Session focused on NMR and dynamic balance. Pt reported that wife could meet for family education on 8/15. Pt reported that he could not tolerate AFO brace stating "it hurt my foot to wear it". PT explained to pt the benefit of AFO brace and need to explore other options or modifications to aid R foot clearance.  Pt ambulated 200 ft X 2 with RW requiring supervision and verbal cuing for heel strike and R foot clearance. Pt performed static standing on foam pad with eyes open 1 min with supervision. Pt progressed to eyes closed for 30 sec x 4 varying from min to max A. Pt performed stepping to targets bilateral LE's with no UE support to facilitate hip stability and challenge balance varying from min to max assist. Pt performed heel taps, progressed to step ups, progressed to lateral step ups, progressed to lateral step overs X 10 bilat with no UE support (except lateral step overs) requiring min to max assist for balance. Pt demonstrated improved R quad control and hip stability with dynamic balance activities. Pt performed figure 8 around cones with RW requiring supervision, pt demonstrated better control with turns with decreased R foot clearance. Pt returned to room  and left sitting in recliner with all needs in reach.   Therapy Documentation Precautions:  Precautions Precautions: Fall Restrictions Weight Bearing Restrictions: No General:   Vital Signs:  Pain: Pain Assessment Pain Assessment: No/denies pain  See FIM for current functional status  Therapy/Group: Individual Therapy  Ivery Quale 08/19/2014, 12:28 PM

## 2014-08-19 NOTE — Progress Notes (Signed)
Subjective/Complaints: No flank pain, no pain with urination  ROS- neg CP, SOB, Abd pain, appetite ok  Objective: Vital Signs: Blood pressure 161/63, pulse 64, temperature 97.6 F (36.4 C), temperature source Oral, resp. rate 17, height  (1.651 m), weight 89.177 kg (196 lb 9.6 oz), SpO2 96 %. No results found. Results for orders placed or performed during the hospital encounter of 08/12/14 (from the past 72 hour(s))  Glucose, capillary     Status: Abnormal   Collection Time: 08/16/14 12:09 PM  Result Value Ref Range   Glucose-Capillary 190 (H) 65 - 99 mg/dL   Comment 1 Notify RN   Glucose, capillary     Status: Abnormal   Collection Time: 08/16/14  5:05 PM  Result Value Ref Range   Glucose-Capillary 115 (H) 65 - 99 mg/dL   Comment 1 Notify RN   Glucose, capillary     Status: Abnormal   Collection Time: 08/16/14  8:49 PM  Result Value Ref Range   Glucose-Capillary 247 (H) 65 - 99 mg/dL  Glucose, capillary     Status: Abnormal   Collection Time: 08/17/14  6:35 AM  Result Value Ref Range   Glucose-Capillary 130 (H) 65 - 99 mg/dL  Glucose, capillary     Status: Abnormal   Collection Time: 08/17/14 11:17 AM  Result Value Ref Range   Glucose-Capillary 115 (H) 65 - 99 mg/dL   Comment 1 Notify RN   Glucose, capillary     Status: Abnormal   Collection Time: 08/17/14  4:28 PM  Result Value Ref Range   Glucose-Capillary 191 (H) 65 - 99 mg/dL   Comment 1 Notify RN   Glucose, capillary     Status: Abnormal   Collection Time: 08/17/14  8:22 PM  Result Value Ref Range   Glucose-Capillary 206 (H) 65 - 99 mg/dL  Glucose, capillary     Status: Abnormal   Collection Time: 08/18/14  6:49 AM  Result Value Ref Range   Glucose-Capillary 100 (H) 65 - 99 mg/dL  Urinalysis, Routine w reflex microscopic (not at [facility])     Status: Abnormal   Collection Time: 08/18/14 11:01 AM  Result Value Ref Range   Color, Urine YELLOW YELLOW   APPearance CLOUDY (A) CLEAR   Specific Gravity, Urine  1.020 1.005 - 1.030   pH 5.0 5.0 - 8.0   Glucose, UA 250 (A) NEGATIVE mg/dL   Hgb urine dipstick LARGE (A) NEGATIVE   Bilirubin Urine NEGATIVE NEGATIVE   Ketones, ur NEGATIVE NEGATIVE mg/dL   Protein, ur 161 (A) NEGATIVE mg/dL   Urobilinogen, UA 0.2 0.0 - 1.0 mg/dL   Nitrite NEGATIVE NEGATIVE   Leukocytes, UA NEGATIVE NEGATIVE  Urine microscopic-add on     Status: Abnormal   Collection Time: 08/18/14 11:01 AM  Result Value Ref Range   RBC / HPF 21-50 <3 RBC/hpf   Crystals URIC ACID CRYSTALS (A) NEGATIVE  Glucose, capillary     Status: None   Collection Time: 08/18/14 11:33 AM  Result Value Ref Range   Glucose-Capillary 95 65 - 99 mg/dL   Comment 1 Notify RN   Glucose, capillary     Status: Abnormal   Collection Time: 08/18/14  5:38 PM  Result Value Ref Range   Glucose-Capillary 110 (H) 65 - 99 mg/dL   Comment 1 Notify RN   Glucose, capillary     Status: Abnormal   Collection Time: 08/18/14  9:17 PM  Result Value Ref Range   Glucose-Capillary 182 (H) 65 -  99 mg/dL   Comment 1 Notify RN   CBC     Status: Abnormal   Collection Time: 08/19/14  6:38 AM  Result Value Ref Range   WBC 7.1 4.0 - 10.5 K/uL   RBC 4.53 4.22 - 5.81 MIL/uL   Hemoglobin 12.9 (L) 13.0 - 17.0 g/dL   HCT 81.1 (L) 91.4 - 78.2 %   MCV 84.1 78.0 - 100.0 fL   MCH 28.5 26.0 - 34.0 pg   MCHC 33.9 30.0 - 36.0 g/dL   RDW 95.6 21.3 - 08.6 %   Platelets 250 150 - 400 K/uL  Glucose, capillary     Status: Abnormal   Collection Time: 08/19/14  6:40 AM  Result Value Ref Range   Glucose-Capillary 156 (H) 65 - 99 mg/dL     HEENT: normal Cardio: RRR and no murmur Resp: CTA B/L and unlabored GI: BS positive and NT, ND Extremity:  Pulses positive and No Edema Skin:   Intact Neuro: Alert/Oriented, Normal Sensory, Abnormal Motor 4/5 on RIght side , 5/5 on Left and Abnormal FMC Ataxic/ dec FMC Musc/Skel:  Other minimal Left lateral knee tenderness Gen NAD   Assessment/Plan: 1. 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 Physiatrist is providing close team supervision and 24 hour management of active medical problems listed below. Physiatrist and rehab team continue to assess barriers to discharge/monitor patient progress toward functional and medical goals. FIM: FIM - Bathing Bathing Steps Patient Completed: Chest, Right Arm, Left Arm, Abdomen, Front perineal area, Buttocks, Right upper leg, Left upper leg Bathing: 4: Min-Patient completes 8-9 61f 10 parts or 75+ percent  FIM - Upper Body Dressing/Undressing Upper body dressing/undressing steps patient completed: Thread/unthread right sleeve of pullover shirt/dresss, Thread/unthread left sleeve of pullover shirt/dress, Put head through opening of pull over shirt/dress, Pull shirt over trunk Upper body dressing/undressing: 5: Supervision: Safety issues/verbal cues FIM - Lower Body Dressing/Undressing Lower body dressing/undressing steps patient completed: Thread/unthread right pants leg, Thread/unthread left pants leg, Pull pants up/down, Don/Doff left sock, Don/Doff left shoe Lower body dressing/undressing: 4: Min-Patient completed 75 plus % of tasks  FIM - Toileting Toileting steps completed by patient: Adjust clothing after toileting, Performs perineal hygiene, Adjust clothing prior to toileting Toileting: 4: Steadying assist  FIM - Diplomatic Services operational officer Devices: Grab bars Toilet Transfers: 4-To toilet/BSC: Min A (steadying Pt. > 75%), 4-From toilet/BSC: Min A (steadying Pt. > 75%)  FIM - Banker Devices: Arm rests, Therapist, occupational: 4: Chair or W/C > Bed: Min A (steadying Pt. > 75%), 5: Supine > Sit: Supervision (verbal cues/safety issues), 5: Sit > Supine: Supervision (verbal cues/safety issues)  FIM - Locomotion:  Wheelchair Distance: 150 Locomotion: Wheelchair: 5: Travels 150 ft or more: maneuvers on rugs and over door sills with supervision, cueing or coaxing FIM - Locomotion: Ambulation Locomotion: Ambulation Assistive Devices: Designer, industrial/product Ambulation/Gait Assistance: 4: Min guard Locomotion: Ambulation: 4: Travels 150 ft or more with minimal assistance (Pt.>75%)  Comprehension Comprehension Mode: Auditory Comprehension: 5-Understands complex 90% of the time/Cues < 10% of the time  Expression Expression Mode: Verbal Expression: 6-Expresses complex ideas: With extra time/assistive device  Social Interaction Social Interaction: 7-Interacts appropriately with others - No medications needed.  Problem Solving Problem Solving: 6-Solves complex problems: With extra time  Memory Memory: 6-More than reasonable amt of time  Medical Problem List  and Plan: 1. Functional deficits secondary to left basal ganglia/corona radiata infarct 2. DVT Prophylaxis/Anticoagulation: Pharmaceutical: Lovenox--indicated due to immobility, Plavix for CVA 3. Pain Management: N/A.  4. Mood: Team to provide ego support. LCSW to follow for evaluation and support.  5. Neuropsych: This patient is capable of making decisions on his own behalf. 6. Skin/Wound Care: Routine pressure relief measures. Maintain adequate fluid and nutritional intake.  7. Fluids/Electrolytes/Nutrition: Monitor I/O.30-60% meals-pt states he ate well yest 8. DM type 2: Poorly controlled due to dietary non-compliance. Will get RD to help with diet education. Will monitor BS with ac/hs CBG checks. Increase levemir at bedtime with SSI for tighter blood sugar control. Continue to hold metformin due to renal insufficiency CKD. Add glucotrol Qam for daytime CBG, increase to 5mg  in am, improving CBGs 9. HTN: Monitor BP every 8 hours. Continue losartan (max dose), coreg (max dose)and hytrin . Titrate medications , increase hytrin to 10mg . am  elevated systolics , 161/63 on 8/12 HR in a good range   11. CKD?: 12. Dyslipidemia: continue Crestor. No side effects 13. Low protein stores: Will add prosource supplements.  14.  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

## 2014-08-19 NOTE — Progress Notes (Signed)
Hypoglycemic Event  CBG: 54  Treatment:glucose gel  Symptoms: swets  Follow-up CBG: Time:1224 CBG Result:147  Possible Reasons for Event: unknown  Comments/MD notified:Dan Anguillo PAC    George Stevens, George Stevens  Remember to initiate Hypoglycemia Order Set & complete

## 2014-08-19 NOTE — Progress Notes (Signed)
Occupational Therapy Weekly Progress Note  Patient Details  Name: George Stevens MRN: [id number] Date of Birth: [birth year]  Beginning of progress report period: August 13, 2014 End of progress report period: August 19, 2014  Today's Date: 08/19/2014 OT Individual Time: 1105-1200 OT Individual Time Calculation (min): 55 min    Patient has met 0 of 5 short term goals.  Pt is making steady progress with therapy at this time and currently needs min assist for balance and safety with functional transfers as well as LB selfcare.  RUE functional use is almost WFLs as pt is currently using it at a dominant level.  He still demonstrates decreased RLE strength with decreased ability to maintain right knee extension in standing and with mobility.  Pt at times with drag his left toe as well and my need use of an AFO but will work with PT to decide.  Pt is scheduled to go home later next week.  Feel he is on target for modified independent to supervision level goals.  Will continue with current treatment plan and education with wife as well.      Patient continues to demonstrate the following deficits: decreased balance, decreased coordination, decreased strength,  and therefore will continue to benefit from skilled OT intervention to enhance overall performance with BADL.  Patient progressing toward long term goals..  Continue plan of care.  OT Short Term Goals Week 2:  OT Short Term Goal 1 (Week 2): Continue to work on modified independent to supervision level goals.   Skilled Therapeutic Interventions/Progress Updates:    Pt worked on bathing and dressing during session.  He was able to ambulate with min assist to gather his clothing prior to the shower, however he does exhibit increased fall risk when not using the assistive device as he drags his right foot at times.  Once in the shower he is able to complete all bathing with min assist, as he needs slight assistance with washing the right foot.   Dressing sit to stand with min assist as well.  Therapist provided something for pt to prop his right foot on for donning his sock.  He was able to complete his shoe without difficulty, including managing the velcro straps.  Once finished he progressed to walking to the ADL apartment and practiced walk-in shower transfer with min assist.  Before returning back to the room pt noted with increased sweating and reporting increased fatigue.  Blood sugar checked at 59.  Pt given orange juice to help increase blood sugar.  Call button and phone also placed within reach.    Therapy Documentation Precautions:  Precautions Precautions: Fall Restrictions Weight Bearing Restrictions: No  Pain: Pain Assessment Pain Assessment: No/denies pain ADL: See FIM for current functional status  Therapy/Group: Individual Therapy  Brigitt Mcclish OTR/L 08/19/2014, 5:01 PM

## 2014-08-19 NOTE — Progress Notes (Signed)
Physical Therapy Session Note  Patient Details  Name: George Stevens MRN: [id number] Date of Birth: Feb 24, 1940  Today's Date: 08/19/2014 PT Individual Time:  -  1330-1500 Treatment Time: 90 min     Short Term Goals: Week 1:  PT Short Term Goal 1 (Week 1): Pt will perform supine to edge of bed. edge of bed to supine with mod I PT Short Term Goal 2 (Week 1): Pt will transfer bed to chair, chair to bed with min A.  PT Short Term Goal 3 (Week 1): Pt will ambulate with rolling walker and min A about 150 feet.  PT Short Term Goal 4 (Week 1): Pt will propel w/c about 150 feet with S.  PT Short Term Goal 5 (Week 1): Pt will ascend/descend 12 stairs with 1 rail and min A.   Skilled Therapeutic Interventions/Progress Updates:    Gait Training: PT instructs pt in ambulation with RW (no AFO) x 175' req SBA - pt has reduced R foot clearance, but with attention to R foot, no LOB req assist to correct.  PT instructs pt in ambulation without AD (and no AFO) x 300' x 2 reps req min A - tall rolling wall mirror used for pt to self correct and balance much improves with less of a limp/fall towards the R side - PT cues pt for arm swing - no arm swing performed, but reduction in R arm position from low guard flexor synergy to arm by side noted.  PT instructs pt in ascending/descending a half flight (12 steps) of stairs with one handrail (R rail) req heavy min A in step-to pattern (R leg leading up, alternating lead leg on descent)  Neuromuscular Reeducation: PT instructs pt in high level dynamic standing balance activities: side stepping R/L req CGA-min A with PT facilitating lateral weight shift to increase WB through R LE in stance, backwards walking req mod A with B forearm assist.  PT instructs pt in R NMR activities in // bars using mirror feedback: squats : 3 x 10 reps - focus on equal weight distribution between legs for forced use of R LE in WB.   Therapeutic Activity: PT instructs pt in mat  mobility with focus on R side strengthening and progression towards falls recovery: crawling in quadruped R/L/A/P on mat req CGA for safety, quadruped to tall kneel on bench to half kneel on bench with UE support on bench as needed x 5 reps with each side req min A for placement of R foot in half kneel and facilitation for weight shift contralaterally.   Pt is progressing with functional mobility - continues to req many rest breaks due to low activity tolerance. Pt does not want an AFO and reports it was very uncomfortable trialing one, yesterday. PT has long discussion with pt re: increased safety using an AFO and how this will facilitate towards ambulation with LRAD and eventually no ambulatory aide. Pt participates well in PT - has fear of loading R LE in stance, making weight shifts & balance difficult - pt reports he feels as if his leg will buckle, but it has not buckled to the extent where he has had a near fall. Pt did well trialing ambulation with no AD with mirror feedback - would recommend continuing this practice. Continue per PT POC.   Therapy Documentation Precautions:  Precautions Precautions: Fall Restrictions Weight Bearing Restrictions: No Pain: Pain Assessment Pain Assessment: No/denies pain  See FIM for current functional status  Therapy/Group: Individual Therapy  [facility] M 08/19/2014, 8:57 AM

## 2014-08-20 ENCOUNTER — Inpatient Hospital Stay (HOSPITAL_COMMUNITY): Payer: Medicare HMO | Admitting: Physical Therapy

## 2014-08-20 LAB — GLUCOSE, CAPILLARY
Glucose-Capillary: 154 mg/dL — ABNORMAL HIGH (ref 65–99)
Glucose-Capillary: 140 mg/dL — ABNORMAL HIGH (ref 65–99)
Glucose-Capillary: 152 mg/dL — ABNORMAL HIGH (ref 65–99)
Glucose-Capillary: 147 mg/dL — ABNORMAL HIGH (ref 65–99)

## 2014-08-20 MED ORDER — INSULIN ASPART 100 UNIT/ML ~~LOC~~ SOLN
8.0000 [IU] | Freq: Three times a day (TID) | SUBCUTANEOUS | Status: DC
  Administered 2014-08-20 – 2014-08-25 (×13): 8 [IU] via SUBCUTANEOUS

## 2014-08-20 NOTE — Progress Notes (Signed)
George Stevens is a 74 y.o. male 05/13/1940 [id number]  Subjective: No new complaints.CBGs - in the 50s. Slept well. Feeling OK.  Objective: Vital signs in last 24 hours: Temp:  [97.8 F (36.6 C)-98.5 F (36.9 C)] 98.5 F (36.9 C) (08/13 1402) Pulse Rate:  [72-75] 75 (08/13 1402) Resp:  [17-18] 17 (08/13 1402) BP: (131-167)/(61-65) 131/65 mmHg (08/13 1402) SpO2:  [97 %] 97 % (08/13 1402) Weight change:  Last BM Date: 08/18/14  Intake/Output from previous day: 08/12 0701 - 08/13 0700 In: 720 [P.O.:720] Out: 852 [Urine:850; Stool:2] Last cbgs: CBG (last 3)   Recent Labs  08/19/14 2123 08/20/14 0629 08/20/14 1153  GLUCAP 197* 154* 140*     Physical Exam General: No apparent distress   HEENT: not dry Lungs: Normal effort. Lungs clear to auscultation, no crackles or wheezes. Cardiovascular: Regular rate and rhythm, no edema Abdomen: S/NT/ND; BS(+) Musculoskeletal:  unchanged Neurological: No new neurological deficits Wounds: N/A    Skin: clear  Aging changes Mental state: Alert, oriented, cooperative    Lab Results: BMET    Component Value Date/Time   NA 136 08/14/2014 0828   K 4.0 08/14/2014 0828   CL 102 08/14/2014 0828   CO2 24 08/14/2014 0828   GLUCOSE 244* 08/14/2014 0828   BUN 28* 08/14/2014 0828   CREATININE 1.63* 08/14/2014 0828   CALCIUM 9.1 08/14/2014 0828   GFRNONAA 40* 08/14/2014 0828   GFRAA 46* 08/14/2014 0828   CBC    Component Value Date/Time   WBC 7.1 08/19/2014 0638   RBC 4.53 08/19/2014 0638   HGB 12.9* 08/19/2014 0638   HCT 38.1* 08/19/2014 0638   PLT 250 08/19/2014 0638   MCV 84.1 08/19/2014 0638   MCH 28.5 08/19/2014 0638   MCHC 33.9 08/19/2014 0638   RDW 12.7 08/19/2014 0638   LYMPHSABS 2.3 08/13/2014 0427   MONOABS 0.8 08/13/2014 0427   EOSABS 0.1 08/13/2014 0427   BASOSABS 0.0 08/13/2014 0427    Studies/Results: No results found.  Medications: I have reviewed the patient's current  medications.  Assessment/Plan:  1. Functional deficits secondary to left basal ganglia/corona radiata infarct 2. DVT Prophylaxis/Anticoagulation: Pharmaceutical: Lovenox--indicated due to immobility, Plavix for CVA 3. Pain Management: N/A.  4. Mood: Team to provide ego support. LCSW to follow for evaluation and support.  5. Neuropsych: This patient is capable of making decisions on his own behalf. 6. Skin/Wound Care: Routine pressure relief measures. Maintain adequate fluid and nutritional intake.  7. Fluids/Electrolytes/Nutrition: Monitor I/O.30-60% meals-pt states he ate well yest 8. DM type 2: Poorly controlled due to dietary non-compliance. Will get RD to help with diet education. Will monitor BS with ac/hs CBG checks. Low CBGs: reduce Novolog. Levemir at bedtime with SSI for tighter blood sugar control. Continue to hold metformin due to renal insufficiency CKD. Add glucotrol Qam for daytime CBG, increase to 5mg  in am, improving CBGs 9. HTN: Monitor BP every 8 hours. Continue losartan (max dose), coreg (max dose)and hytrin . Titrate medications , increase hytrin to 10mg . am elevated systolics , 161/63 on 8/12 HR in a good range 11. CKD?: 12. Dyslipidemia: continue Crestor. No side effects 13. Low protein stores: Will add prosource supplements.  14. Urinary freq occurred prior to CVA, on hytrin already, check PVR, UA with RBC and urate crystals but no WBCs, hx of uric acid stones was on allopurinol in past has this at home, recent attack, can resume once he gets home next wk    Length of stay, days: 8  Sonda Primes , MD 08/20/2014, 2:47 PM

## 2014-08-20 NOTE — Progress Notes (Signed)
Physical Therapy Weekly Progress Note  Patient Details  Name: George Stevens MRN: [id number] Date of Birth: 02/13/1940  Beginning of progress report period: August 13, 2014 End of progress report period: August 20, 2014  Today's Date: 08/20/2014 PT Individual Time:  -  1445-1530 Treatment Time: 45 min     Patient has met 4 of 5 short term goals; W/C goal not applicable. Pt has demonstrated improvement in all aspects of functional mobility and activity tolerance. Balance continues to be impaired, with pt resisting weight shifting onto hemiplegic leg during ambulation without AD.   Patient continues to demonstrate the following deficits: low activity tolerance, impaired standing balance, hemiplegia, difficulty with gait and stairs and therefore will continue to benefit from skilled PT intervention to enhance overall performance with activity tolerance, balance, postural control, ability to compensate for deficits, functional use of  right lower extremity and coordination.  Patient progressing toward long term goals..  Continue plan of care.  PT Short Term Goals Week 1:  PT Short Term Goal 1 (Week 1): Pt will perform supine to edge of bed. edge of bed to supine with mod I PT Short Term Goal 1 - Progress (Week 1): Met PT Short Term Goal 2 (Week 1): Pt will transfer bed to chair, chair to bed with min A.  PT Short Term Goal 2 - Progress (Week 1): Met PT Short Term Goal 3 (Week 1): Pt will ambulate with rolling walker and min A about 150 feet.  PT Short Term Goal 3 - Progress (Week 1): Met PT Short Term Goal 4 (Week 1): Pt will propel w/c about 150 feet with S.  PT Short Term Goal 4 - Progress (Week 1): Discontinued (comment) (due to ambulatory status) PT Short Term Goal 5 (Week 1): Pt will ascend/descend 12 stairs with 1 rail and min A.  PT Short Term Goal 5 - Progress (Week 1): Met Week 2:  PT Short Term Goal 1 (Week 2): STGs = LTGs due to ELOS  Skilled Therapeutic Interventions/Progress  Updates:    Gait Training: PT instructs pt to focus on R foot clearance with ambulation on level surface in controlled environment 200' x 2 reps with RW - no LOB req assist to correct.  PT instructs pt in ambulation up/down 12 standard steps with R rail req CGA for safety - 1 LOB on descent req min A to correct from retropulsion (R leg supportive and L leg descending) - step to pattern.   Therapeutic Activity: Pt demonstrates mod I supine to/from sit transfer in flat bed without rails and SBA stand-step transfer with RW bed to/from recliner.  PT instructs pt on how to use RW as a "door jam" when walking through a door (swing away, and swing toward) - fire door used and pt completes this activity with cues and close SBA. On initial attempt, pt attempts to swing fire door wide and rush through, but then stubs his R foot and is able to recovery, but nearly loses his balance forward.   Neuromuscular Reeducation: Five times Sit to Stand Test (FTSS) Method: Use a straight back chair with a solid seat that is 16-18" high. Ask participant to sit on the chair with arms folded across their chest.   Instructions: "Stand up and sit down as quickly as possible 5 times, keeping your arms folded across your chest."   Measurement: Stop timing when the participant stands the 5th time.  TIME: __11.18____ (in seconds)  Times > 13.6 seconds is associated with  increased disability and morbidity (Guralnik, 2000) Times > 15 seconds is predictive of recurrent falls in healthy individuals aged 38 and older (Buatois, et al., 2008) Normal performance values in community dwelling individuals aged 16 and older (Bohannon, 2006): o 60-69 years: 11.4 seconds o 70-79 years: 12.6 seconds o 80-89 years: 14.8 seconds  MCID: ? 2.3 seconds for Vestibular Disorders Mariah Milling, 2006)  PT administers Berg Balance Test and pt scores 37/56, indicating high falls risk.   Pt's balance and activity tolerance and functional mobility  are all improving, but pt continues to req high level balance training. Continue per PT POC.   Therapy Documentation Precautions:  Precautions Precautions: Fall Restrictions Weight Bearing Restrictions: No Pain: Pain Assessment Pain Assessment: No/denies pain  Balance: Balance Balance Assessed: Yes Standardized Balance Assessment Standardized Balance Assessment: Berg Balance Test Berg Balance Test Sit to Stand: Able to stand without using hands and stabilize independently Standing Unsupported: Able to stand safely 2 minutes Sitting with Back Unsupported but Feet Supported on Floor or Stool: Able to sit safely and securely 2 minutes Stand to Sit: Sits safely with minimal use of hands Transfers: Able to transfer safely, definite need of hands Standing Unsupported with Eyes Closed: Able to stand 10 seconds safely Standing Ubsupported with Feet Together: Needs help to attain position but able to stand for 30 seconds with feet together From Standing, Reach Forward with Outstretched Arm: Can reach confidently >25 cm (10") From Standing Position, Pick up Object from Floor: Able to pick up shoe, needs supervision From Standing Position, Turn to Look Behind Over each Shoulder: Looks behind from both sides and weight shifts well Turn 360 Degrees: Needs close supervision or verbal cueing Standing Unsupported, Alternately Place Feet on Step/Stool: Needs assistance to keep from falling or unable to try Standing Unsupported, One Foot in Front: Needs help to step but can hold 15 seconds Standing on One Leg: Unable to try or needs assist to prevent fall Total Score: 37  See FIM for current functional status  Therapy/Group: Individual Therapy  Laurisa Sahakian M 08/20/2014, 12:44 PM

## 2014-08-21 ENCOUNTER — Inpatient Hospital Stay (HOSPITAL_COMMUNITY): Payer: Medicare HMO | Admitting: Occupational Therapy

## 2014-08-21 DIAGNOSIS — M1A09X0 Idiopathic chronic gout, multiple sites, without tophus (tophi): Secondary | ICD-10-CM

## 2014-08-21 LAB — GLUCOSE, CAPILLARY
GLUCOSE-CAPILLARY: 177 mg/dL — AB (ref 65–99)
GLUCOSE-CAPILLARY: 218 mg/dL — AB (ref 65–99)
Glucose-Capillary: 166 mg/dL — ABNORMAL HIGH (ref 65–99)
Glucose-Capillary: 151 mg/dL — ABNORMAL HIGH (ref 65–99)

## 2014-08-21 NOTE — Progress Notes (Signed)
George Stevens is a 74 y.o. male 28-Aug-1940 [id number]  Subjective: No new complaints. No low CBGs. Slept well. Feeling OK.  Objective: Vital signs in last 24 hours: Temp:  [97.6 F (36.4 C)-98.5 F (36.9 C)] 97.6 F (36.4 C) (08/14 4098) Pulse Rate:  [63-75] 63 (08/14 0632) Resp:  [17-18] 18 (08/14 0632) BP: (131-174)/(58-85) 155/62 mmHg (08/14 0850) SpO2:  [97 %] 97 % (08/13 1402) Weight change:  Last BM Date: 08/19/14  Intake/Output from previous day: 08/13 0701 - 08/14 0700 In: 960 [P.O.:720] Out: 1601 [Urine:1450; Emesis/NG output:150; Stool:1] Last cbgs: CBG (last 3)   Recent Labs  08/20/14 2102 08/21/14 0636 08/21/14 1140  GLUCAP 147* 166* 151*     Physical Exam General: No apparent distress  He is trying to catch up on sleep HEENT: not dry Lungs: Normal effort. Lungs clear to auscultation, no crackles or wheezes. Cardiovascular: Regular rate and rhythm, no edema Abdomen: S/NT/ND; BS(+) Musculoskeletal:  unchanged Neurological: No new neurological deficits Wounds: N/A    Skin: clear  Aging changes Mental state: Alert, oriented, cooperative    Lab Results: BMET    Component Value Date/Time   NA 136 08/14/2014 0828   K 4.0 08/14/2014 0828   CL 102 08/14/2014 0828   CO2 24 08/14/2014 0828   GLUCOSE 244* 08/14/2014 0828   BUN 28* 08/14/2014 0828   CREATININE 1.63* 08/14/2014 0828   CALCIUM 9.1 08/14/2014 0828   GFRNONAA 40* 08/14/2014 0828   GFRAA 46* 08/14/2014 0828   CBC    Component Value Date/Time   WBC 7.1 08/19/2014 0638   RBC 4.53 08/19/2014 0638   HGB 12.9* 08/19/2014 0638   HCT 38.1* 08/19/2014 0638   PLT 250 08/19/2014 0638   MCV 84.1 08/19/2014 0638   MCH 28.5 08/19/2014 0638   MCHC 33.9 08/19/2014 0638   RDW 12.7 08/19/2014 0638   LYMPHSABS 2.3 08/13/2014 0427   MONOABS 0.8 08/13/2014 0427   EOSABS 0.1 08/13/2014 0427   BASOSABS 0.0 08/13/2014 0427    Studies/Results: No results found.  Medications: I have reviewed  the patient's current medications.  Assessment/Plan:  1. Functional deficits secondary to left basal ganglia/corona radiata infarct 2. DVT Prophylaxis/Anticoagulation: Pharmaceutical: Lovenox--indicated due to immobility, Plavix for CVA 3. Pain Management: N/A.  4. Mood: Team to provide ego support. LCSW to follow for evaluation and support.  5. Neuropsych: This patient is capable of making decisions on his own behalf. 6. Skin/Wound Care: Routine pressure relief measures. Maintain adequate fluid and nutritional intake.  7. Fluids/Electrolytes/Nutrition: Monitor I/O.30-60% meals-pt states he ate well yest 8. DM type 2: Poorly controlled due to dietary non-compliance. Will get RD to help with diet education. Will monitor BS with ac/hs CBG checks. Low CBGs: reduce Novolog. Levemir at bedtime with SSI for tighter blood sugar control. Continue to hold metformin due to renal insufficiency CKD. Add glucotrol Qam for daytime CBG, increase to 5mg  in am, improving CBGs 9. HTN: Monitor BP every 8 hours. Continue losartan (max dose), coreg (max dose)and hytrin . Titrate medications , increase hytrin to 10mg . am elevated systolics , 161/63 on 8/12 HR in a good range 11. CKD?: 12. Dyslipidemia: continue Crestor. No side effects 13. Low protein stores: Will add prosource supplements.  14. 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

## 2014-08-21 NOTE — Progress Notes (Signed)
Occupational Therapy Session Note  Patient Details  Name: George Stevens MRN: [id number] Date of Birth: April 22, 1940  Today's Date: 08/21/2014 OT Individual Time:  -   1415-1500  (45 min)      Short Term Goals: Week 1:  OT Short Term Goal 1 (Week 1): Pt.will be mod I with LB bathing using AE prn OT Short Term Goal 1 - Progress (Week 1): Not met OT Short Term Goal 2 (Week 1): Pt will be mod I with LB dressing using AE PRN OT Short Term Goal 2 - Progress (Week 1): Not met OT Short Term Goal 3 (Week 1): Pt will transfer to toilet with RW at mod I OT Short Term Goal 3 - Progress (Week 1): Not met OT Short Term Goal 4 (Week 1): Pt will perform 3/3 toileting items at mod I OT Short Term Goal 4 - Progress (Week 1): Not met OT Short Term Goal 5 (Week 1): Pt. will SBA with dynamic standing balance in shower stall OT Short Term Goal 5 - Progress (Week 1): Not met Week 2:  OT Short Term Goal 1 (Week 2): Continue to work on modified independent to supervision level goals.   Skilled Therapeutic Interventions/Progress Updates:    No Pain.  Ambulated to gym with close supervision and cues for engage in heel toe strategy.    RUE Hand grip was 70 # (average of 3 trials);  Marland Kitchen LUE= 80 # (avg of 3).  R lateral pinch=,14 ;   Left lateral pinch=  16. Instructed pt in handwriting and is baseline.  Pt. Engaged in ring toss with RUE.  Ambulated to pick up rings with min assist for balance when reaching to the floor.  .  Ambulated back to room with RW due to RLE toe drag.  Left pt in wc with safety belt on and call bell,phone within reach.    Therapy Documentation Precautions:  Precautions Precautions: Fall Restrictions Weight Bearing Restrictions: No      Pain:  none     See FIM for current functional status  Therapy/Group: Individual Therapy  Lisa Roca 08/21/2014, 2:38 PM

## 2014-08-22 ENCOUNTER — Inpatient Hospital Stay (HOSPITAL_COMMUNITY): Payer: Medicare HMO

## 2014-08-22 ENCOUNTER — Inpatient Hospital Stay (HOSPITAL_COMMUNITY): Payer: Medicare HMO | Admitting: Occupational Therapy

## 2014-08-22 LAB — GLUCOSE, CAPILLARY
GLUCOSE-CAPILLARY: 130 mg/dL — AB (ref 65–99)
GLUCOSE-CAPILLARY: 181 mg/dL — AB (ref 65–99)
Glucose-Capillary: 186 mg/dL — ABNORMAL HIGH (ref 65–99)
Glucose-Capillary: 134 mg/dL — ABNORMAL HIGH (ref 65–99)

## 2014-08-22 NOTE — Progress Notes (Signed)
Social Work Patient ID: George Stevens, male   DOB: [birth year], 74 y.o.   MRN: [id number]   CSW received phone call from pt's wife asking for a handicap placard application for pt.  CSW will obtain this signed application from Dr. Letta Pate and give to pt's wife.  CSW met with pt and wife to explain this to them.  They expressed understanding.  Then pt was concerned about his insurance not covering his stay past today.  CSW explained that they approved an initial 10 days and then ask for clinicals to support further stay as needed.  Told him we'd fax those clinicals today and then await their approval and CSW will keep him posted.  Explained that as long as there are things for our therapists to progress him on, then approval should be received.  Otherwise, the therapists would be moving him toward d/c.  Assured him that we will only keep him here as long as he needs to be here and no longer than necessary.  He expressed understanding, but also reiterated that he is ready to go home.  CSW will continue to follow and assist as needed.

## 2014-08-22 NOTE — Progress Notes (Addendum)
Physical Therapy Session Note  Patient Details  Name: George Stevens MRN: [id number] Date of Birth: 03-08-1940  Today's Date: 08/22/2014 PT Individual Time: 4098-1191; 1107-1205; 1510-1550 PT Individual Time Calculation (min): 59 min , 57 min, 40 min  Short Term Goals: Week 1:  PT Short Term Goal 1 (Week 1): Pt will perform supine to edge of bed. edge of bed to supine with mod I PT Short Term Goal 1 - Progress (Week 1): Met PT Short Term Goal 2 (Week 1): Pt will transfer bed to chair, chair to bed with min A.  PT Short Term Goal 2 - Progress (Week 1): Met PT Short Term Goal 3 (Week 1): Pt will ambulate with rolling walker and min A about 150 feet.  PT Short Term Goal 3 - Progress (Week 1): Met PT Short Term Goal 4 (Week 1): Pt will propel w/c about 150 feet with S.  PT Short Term Goal 4 - Progress (Week 1): Discontinued (comment) (due to ambulatory status) PT Short Term Goal 5 (Week 1): Pt will ascend/descend 12 stairs with 1 rail and min A.  PT Short Term Goal 5 - Progress (Week 1): Met Week 2:  PT Short Term Goal 1 (Week 2): STGs = LTGs due to ELOS  Skilled Therapeutic Interventions/Progress Updates:   Tx 1:  neuromuscular re-education via VCS, demo, forced use, manual cues as below  Gait with RW x 20' forward/backward using mirror, min assist, max cues for widening BOS, hip and knee flexion.  Gait up/down 12 steps 2 rails with step-to method>step through method ascending and descending, min assist. Poor eccentric control noted descending last step, L foot leading.  With RW x 200' with min guard assist, mod cues for R hip and knee flexion due to R foot drop and to decrease velocity; no LOB. Pt and wife reported that pt had a fast gait PTA.  Pt resting in recliner with all needs in place; wife present.  Tx 2:  W/c propulsion using bil hands, x 50' supervision.  Gait outdoors on uneven brick paving, x 50' x 2 with min guard assist, RW. Gait on level tile x 200' with min guard>  supervision, min cues for R foot clearance and slower velocity.  neuromuscular re-education via demo, VCs, manual cues for standing : without AD- sidestepping L<>R, backwards walking.  Sitting: resisted bil hip abd, calf raises, ankle pumps, 10 x 1  each.  For coordination, alternating reciprocal movements R/LLEs in sitting.   Therapeutic activity in standing: floor clock with min> mod assist for balance during R SLS, tapping with L foot.  Pt left resting in recliner, set up for lunch, all needs in place and wife present.  Tx 3:  Gait with RW, Foot Up brace L x 200' x 2 with min guard>supervision assist, min cues for velocity and wider BOS. Pt demonstrated improved BOS during gait by end of session.   neuromuscular re-education via hand out, demo, VCs, manual cues for Fall Prevention exs 10 x 1 each in standing: hip abduction L/R and calf raises/toe raises, with mod assist overall for balance due to over-shift to R. Squatting activity manipulating cones with R hand for sustained hip abd and quad activation.    Pt left resting in recliner with all needs within reach. Therapy Documentation Precautions:  Precautions Precautions: Fall Restrictions Weight Bearing Restrictions: No  Pain: Pain Assessment Pain Assessment: No/denies pain   Locomotion : Ambulation Ambulation/Gait Assistance: 5: Supervision;4: Min guard Wheelchair Mobility Distance: 50  Other Treatments: Treatments Therapeutic Activity: scooting forward/backward in unsupported sitting, isometric pelvic protraction, isometric bil scapular adduction,reaching out of BOS L and R, self stretching R hamstrings in supported sitting, gait forward/backward with RW with mirror feedback  Neuromuscular Facilitation: Right;Forced use;Activity to increase grading;Activity to increase timing and sequencing;Activity to increase motor control;Activity to increase coordination;Activity to increase anterior-posterior weight shifting;Activity  to increase lateral weight shifting Weight Bearing Technique Weight Bearing Technique: Yes RUE Weight Bearing Technique: Forearm seated  See FIM for current functional status  Therapy/Group: Individual Therapy  Daziyah Cogan 08/22/2014, 12:28 PM

## 2014-08-22 NOTE — Progress Notes (Signed)
Recreational Therapy Session Note  Patient Details  Name: George Stevens MRN: [id number] Date of Birth: [birth year] Today's Date: 08/22/2014  Pain: no c/o Skilled Therapeutic Interventions/Progress Updates: Met with pt to discuss community reintegration including it's purpose & potential goals. Pt agreeable to participate.  [place] 08/22/2014, 3:27 PM

## 2014-08-22 NOTE — Progress Notes (Signed)
Subjective/Complaints: Pt would like to go home prior to 8/20 Wife coming in for training today  ROS- neg CP, SOB, Abd pain, appetite ok  Objective: Vital Signs: Blood pressure 134/49, pulse 58, temperature 97.7 F (36.5 C), temperature source Oral, resp. rate 18, height 5\' 5"  (1.651 m), weight 89.177 kg (196 lb 9.6 oz), SpO2 99 %. No results found. Results for orders placed or performed during the hospital encounter of 08/12/14 (from the past 72 hour(s))  Glucose, capillary     Status: Abnormal   Collection Time: 08/19/14 11:58 AM  Result Value Ref Range   Glucose-Capillary 54 (L) 65 - 99 mg/dL   Comment 1 Notify RN   Glucose, capillary     Status: Abnormal   Collection Time: 08/19/14 12:24 PM  Result Value Ref Range   Glucose-Capillary 147 (H) 65 - 99 mg/dL   Comment 1 Notify RN   Glucose, capillary     Status: Abnormal   Collection Time: 08/19/14  4:26 PM  Result Value Ref Range   Glucose-Capillary 180 (H) 65 - 99 mg/dL   Comment 1 Notify RN   Glucose, capillary     Status: Abnormal   Collection Time: 08/19/14  9:23 PM  Result Value Ref Range   Glucose-Capillary 197 (H) 65 - 99 mg/dL   Comment 1 Notify RN    Comment 2 Document in Chart   Glucose, capillary     Status: Abnormal   Collection Time: 08/20/14  6:29 AM  Result Value Ref Range   Glucose-Capillary 154 (H) 65 - 99 mg/dL  Glucose, capillary     Status: Abnormal   Collection Time: 08/20/14 11:53 AM  Result Value Ref Range   Glucose-Capillary 140 (H) 65 - 99 mg/dL   Comment 1 Notify RN   Glucose, capillary     Status: Abnormal   Collection Time: 08/20/14  4:24 PM  Result Value Ref Range   Glucose-Capillary 152 (H) 65 - 99 mg/dL  Glucose, capillary     Status: Abnormal   Collection Time: 08/20/14  9:02 PM  Result Value Ref Range   Glucose-Capillary 147 (H) 65 - 99 mg/dL  Glucose, capillary     Status: Abnormal   Collection Time: 08/21/14  6:36 AM  Result Value Ref Range   Glucose-Capillary 166 (H) 65 -  99 mg/dL  Glucose, capillary     Status: Abnormal   Collection Time: 08/21/14 11:40 AM  Result Value Ref Range   Glucose-Capillary 151 (H) 65 - 99 mg/dL  Glucose, capillary     Status: Abnormal   Collection Time: 08/21/14  4:43 PM  Result Value Ref Range   Glucose-Capillary 177 (H) 65 - 99 mg/dL  Glucose, capillary     Status: Abnormal   Collection Time: 08/21/14 10:15 PM  Result Value Ref Range   Glucose-Capillary 218 (H) 65 - 99 mg/dL     HEENT: normal Cardio: RRR and no murmur Resp: CTA B/L and unlabored GI: BS positive and NT, ND Extremity:  Pulses positive and No Edema Skin:   Intact Neuro: Alert/Oriented, Normal Sensory, Abnormal Motor 4/5 on RIght side , 5/5 on Left and Abnormal FMC Ataxic/ dec FMC Musc/Skel:  Other minimal Left lateral knee tenderness Gen NAD   Assessment/Plan: 1. 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 Physiatrist is providing close team supervision and 24 hour management of active medical problems  listed below. Physiatrist and rehab team continue to assess barriers to discharge/monitor patient progress toward functional and medical goals. FIM: FIM - Bathing Bathing Steps Patient Completed: Chest, Right Arm, Left Arm, Abdomen, Front perineal area, Buttocks, Right upper leg, Left upper leg, Right lower leg (including foot), Left lower leg (including foot) Bathing: 5: Supervision: Safety issues/verbal cues  FIM - Upper Body Dressing/Undressing Upper body dressing/undressing steps patient completed: Thread/unthread right sleeve of pullover shirt/dresss, Thread/unthread left sleeve of pullover shirt/dress, Put head through opening of pull over shirt/dress, Pull shirt over trunk Upper body dressing/undressing: 5: Supervision: Safety issues/verbal cues FIM - Lower Body Dressing/Undressing Lower  body dressing/undressing steps patient completed: Thread/unthread right pants leg, Thread/unthread left pants leg, Pull pants up/down, Don/Doff left sock, Don/Doff left shoe Lower body dressing/undressing: 4: Min-Patient completed 75 plus % of tasks  FIM - Toileting Toileting steps completed by patient: Adjust clothing prior to toileting, Performs perineal hygiene, Adjust clothing after toileting Toileting Assistive Devices: Grab bar or rail for support Toileting: 5: Supervision: Safety issues/verbal cues  FIM - Diplomatic Services operational officer Devices: Grab bars Toilet Transfers: 4-To toilet/BSC: Min A (steadying Pt. > 75%), 4-From toilet/BSC: Min A (steadying Pt. > 75%)  FIM - Bed/Chair Transfer Bed/Chair Transfer Assistive Devices: Walker, Arm rests Bed/Chair Transfer: 6: Supine > Sit: No assist, 6: Sit > Supine: No assist, 5: Bed > Chair or W/C: Supervision (verbal cues/safety issues), 5: Chair or W/C > Bed: Supervision (verbal cues/safety issues)  FIM - Locomotion: Wheelchair Distance: 150 Locomotion: Wheelchair: 0: Activity did not occur FIM - Locomotion: Ambulation Locomotion: Ambulation Assistive Devices: Designer, industrial/product Ambulation/Gait Assistance: 5: Supervision Locomotion: Ambulation: 5: Travels 150 ft or more with supervision/safety issues  Comprehension Comprehension Mode: Auditory Comprehension: 6-Follows complex conversation/direction: With extra time/assistive device  Expression Expression Mode: Verbal Expression: 6-Expresses complex ideas: With extra time/assistive device  Social Interaction Social Interaction: 6-Interacts appropriately with others with medication or extra time (anti-anxiety, antidepressant).  Problem Solving Problem Solving: 6-Solves complex problems: With extra time  Memory Memory: 6-More than reasonable amt of time  Medical Problem List and Plan: 1. Functional deficits secondary to left basal ganglia/corona radiata infarct 2.  DVT Prophylaxis/Anticoagulation: Pharmaceutical: Lovenox--indicated due to immobility, Plavix for CVA 3. Pain Management: N/A.  4. Mood: Team to provide ego support. LCSW to follow for evaluation and support.  5. Neuropsych: This patient is capable of making decisions on his own behalf. 6. Skin/Wound Care: Routine pressure relief measures. Maintain adequate fluid and nutritional intake.  7. Fluids/Electrolytes/Nutrition: Monitor I/O. 8. DM type 2: Poorly controlled due to dietary non-compliance. Will get RD to help with diet education. Will monitor BS with ac/hs CBG checks. Increase levemir at bedtime with SSI for tighter blood sugar control. Continue to hold metformin due to renal insufficiency CKD. On glucotrol Qam for daytime CBG, increase to  in am, improving CBGs, hypo glycemic episode, 8/12, change glucotrol to xl 9. HTN: Monitor BP every 8 hours. Continue losartan (max dose), coreg (max dose)and hytrin (max dose) . Titrate medications , increase hytrin to . am systolics are now in a good range ~130s   11. CKD?: 12. Dyslipidemia: continue Crestor. No side effects 13. Low protein stores: Will add prosource supplements.  14.  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

## 2014-08-22 NOTE — Progress Notes (Signed)
Occupational Therapy Session Note  Patient Details  Name: George Stevens MRN: [id number] Date of Birth: April 09, 1940  Today's Date: 08/22/2014 OT Individual Time: 0800-0900 OT Individual Time Calculation (min): 60 min    Short Term Goals: Week 2:  OT Short Term Goal 1 (Week 2): Continue to work on modified independent to supervision level goals.   Skilled Therapeutic Interventions/Progress Updates:    Bathing and dressing performed during session with wife present for education.  Discussed that currently pt needs use of the RW for safety as he continues to demonstrate weakness in the RLE, resulting in increased fall risk.  While in the room had pt work on mobility to and from the bathroom without use of the RW to challenge him more.  Min steady assistance needed.  He was able to complete all bathing except washing his feet.  He demonstrates decreased flexibility to reaching his feet for both washing and dressing tasks.  Discussed use of AE (reacher, sockaide, LH sponge).  Pt reports having a LH brush and reach at home.  He was able to sit on a lower chair this session and donn his socks but exhibited decreased ability to pull them up all the way up over his heels, so his wife assisted him.  He was able to slid his velcro shoes on without unfastening them.  Practiced walk-in shower transfer in the apartment using simulated shower edge.  Discussed placement of shower chair as well as where to install grab bar if they chose.  Concluded session working on RUE coordination in sitting on therapy mat after ambulating to the gym.  Educated pt and wife on coordination tasks and provided handout for reference.  Pt still with slower movement and coordination with reaching tasks on the right side when compared to the left.  PT came in the gym to take over for next session.     Therapy Documentation Precautions:  Precautions Precautions: Fall Restrictions Weight Bearing Restrictions: No  Pain: Pain  Assessment Pain Assessment: No/denies pain ADL: See FIM for current functional status  Therapy/Group: Individual Therapy  Danelly Hassinger OTR/L 08/22/2014, 12:48 PM

## 2014-08-23 ENCOUNTER — Inpatient Hospital Stay (HOSPITAL_COMMUNITY): Payer: Medicare HMO | Admitting: *Deleted

## 2014-08-23 ENCOUNTER — Inpatient Hospital Stay (HOSPITAL_COMMUNITY): Payer: Medicare HMO | Admitting: Physical Therapy

## 2014-08-23 ENCOUNTER — Inpatient Hospital Stay (HOSPITAL_COMMUNITY): Payer: Medicare HMO | Admitting: Occupational Therapy

## 2014-08-23 LAB — GLUCOSE, CAPILLARY
GLUCOSE-CAPILLARY: 156 mg/dL — AB (ref 65–99)
GLUCOSE-CAPILLARY: 77 mg/dL (ref 65–99)
GLUCOSE-CAPILLARY: 172 mg/dL — AB (ref 65–99)
Glucose-Capillary: 164 mg/dL — ABNORMAL HIGH (ref 65–99)

## 2014-08-23 NOTE — Progress Notes (Signed)
Recreational Therapy Session Note  Patient Details  Name: Tajh Livsey MRN: [id number] Date of Birth: [birth year] Today's Date: 08/23/2014  Pain: c/o right foot pain, specifically the brace Skilled Therapeutic Interventions/Progress Updates: Pt participated in community reintegration/outing to the grocery store at overall close supervision-contact guard assist ambulatory level using RW.  Goals focused on safe functional mobility on various community surfaces, identification & negotiation of obstacles, accessing public restroom, retrieving items for various heights of shelves using RUE, and identification & use of energy conservation techniques.  Pt stated appreciation for the outing and performed better than he had anticipated.  Pt ambulated throughout the outing with RW or pushing grocery cart without seated rest breaks.  See outing goal sheet in shadow chart for full details.  Therapy/Group: [employer]   Paulmichael Schreck 08/23/2014, 3:42 PM

## 2014-08-23 NOTE — Progress Notes (Addendum)
Physical Therapy Session Note  Patient Details  Name: George Stevens MRN: [id number] Date of Birth: 02-16-1940  Today's Date: 08/23/2014 PT Individual Time: 1030-1200 (concurrent) 1445-1500 (concurrent), 1500-1530 (individual) PT Individual Time Calculation (min): 105 min (concurrent) and 30 min (individual)  Short Term Goals: Week 2:  PT Short Term Goal 1 (Week 2): STGs = LTGs due to ELOS  Skilled Therapeutic Interventions/Progress Updates:    1030-1200: Pt participated in community reintegration/outing to the grocery store at overall close supervision-contact guard assist ambulatory level using RW. Goals focused on safe functional mobility on various community surfaces, identification & negotiation of obstacles, accessing public restroom, retrieving items for various heights of shelves using RUE, and identification & use of energy conservation techniques. Pt stated appreciation for the outing and performed better than he had anticipated. Pt ambulated throughout the outing with RW or pushing grocery cart without seated rest breaks. See outing goal sheet in shadow chart for full details.  [id number]: Initial 15 min concurrent; last 30 min individual. Pt received seated in recliner with no c/o pain and agreeable to treatment. Pt transferred recliner> w/c with supervision and RW. Self-propelled to gym using BUEs x150' with supervision. Performed ascent/descent of ramp and small curb step (3") with RW and CGA x 2 trials overall. Initial attempt to ascend curb, pt requires cues for sequencing when trying to step up prior to moving RW. Stand balance activities performed while kicking soccer ball to volunteer with alternating BLEs. One major LOB requiring maxA from therapist to maintain balance. Stair training in stairwell to simulate community setting; performed ascent/descent of 12 6-inch stairs with CGA/supervision and cues for step-to pattern for increased safety. Gait training in hallway using RW  x2 trials of approximately 250' each with supervision. Pt returned to room and remained seated in recliner with all needs within reach.   Therapy Documentation Precautions:  Precautions Precautions: Fall Restrictions Weight Bearing Restrictions: No Pain: Pain Assessment Pain Assessment: No/denies pain Pain Score: 0-No pain   Function:    Transfers Sit to stand transfer        Chair/bed transfer   Chair/bed transfer method: Ambulatory Chair/bed transfer assist level: Touching or steadying assistance (Pt > 75%) Chair/bed transfer assistive device: Walker   Chair/bed transfer details: Manual facilitation for weight shifting   Copy device: Walker-rolling Max distance: 250 Assist level: Supervision or verbal cues  Walk 10 feet activity      Walk 50 feet with 2 turns activity   Assist level: Supervision or verbal cues  Walk 150 feet activity   Assist level: Supervision or verbal cues  Walk 10 feet on uneven surfaces activity      Stairs   Stairs assistive device: 1 hand rail Max number of stairs: 12 Stairs assist level: Touching or steadying assistance (Pt > 75%)  Walk up/down 1 step activity     Walk up/down 1 step (curb) assist level: Touching or steadying assistance (Pt > 75%)  Walk up/down 4 steps activity   Walk up/down 4 steps assist level: Touching or steadying assistance (Pt > 75%)  Walk up/down 12 steps activity   Walk up/down 12 steps assist level: Touching or steadying assistance (Pt > 75%)  Pick up small objects from floor      Wheelchair   Type: Manual Max wheelchair distance: 150 Assist Level: Supervision or verbal cues  Wheel 50  feet with 2 turns activity   Assist Level: Supervision or verbal cues  Wheel 150 feet activity   Assist Level: Supervision or verbal cues      Therapy/Group: Individual Therapy and concurrent  Vista Lawman 08/23/2014, 3:42 PM

## 2014-08-23 NOTE — Progress Notes (Addendum)
Subjective/Complaints: No pains, wife in for training yesterday Asking about handicapped parking tag ROS- neg CP, -SOB,- Abd pain, appetite good  Objective: Vital Signs: Blood pressure 143/80, pulse 82, temperature 98 F (36.7 C), temperature source Oral, resp. rate 18, height  (1.651 m), weight 89.177 kg (196 lb 9.6 oz), SpO2 97 %. No results found. Results for orders placed or performed during the hospital encounter of 08/12/14 (from the past 72 hour(s))  Glucose, capillary     Status: Abnormal   Collection Time: 08/20/14 11:53 AM  Result Value Ref Range   Glucose-Capillary 140 (H) 65 - 99 mg/dL   Comment 1 Notify RN   Glucose, capillary     Status: Abnormal   Collection Time: 08/20/14  4:24 PM  Result Value Ref Range   Glucose-Capillary 152 (H) 65 - 99 mg/dL  Glucose, capillary     Status: Abnormal   Collection Time: 08/20/14  9:02 PM  Result Value Ref Range   Glucose-Capillary 147 (H) 65 - 99 mg/dL  Glucose, capillary     Status: Abnormal   Collection Time: 08/21/14  6:36 AM  Result Value Ref Range   Glucose-Capillary 166 (H) 65 - 99 mg/dL  Glucose, capillary     Status: Abnormal   Collection Time: 08/21/14 11:40 AM  Result Value Ref Range   Glucose-Capillary 151 (H) 65 - 99 mg/dL  Glucose, capillary     Status: Abnormal   Collection Time: 08/21/14  4:43 PM  Result Value Ref Range   Glucose-Capillary 177 (H) 65 - 99 mg/dL  Glucose, capillary     Status: Abnormal   Collection Time: 08/21/14 10:15 PM  Result Value Ref Range   Glucose-Capillary 218 (H) 65 - 99 mg/dL  Glucose, capillary     Status: Abnormal   Collection Time: 08/22/14  6:48 AM  Result Value Ref Range   Glucose-Capillary 186 (H) 65 - 99 mg/dL  Glucose, capillary     Status: Abnormal   Collection Time: 08/22/14 12:09 PM  Result Value Ref Range   Glucose-Capillary 134 (H) 65 - 99 mg/dL  Glucose, capillary     Status: Abnormal   Collection Time: 08/22/14  4:29 PM  Result Value Ref Range    Glucose-Capillary 130 (H) 65 - 99 mg/dL  Glucose, capillary     Status: Abnormal   Collection Time: 08/22/14  8:47 PM  Result Value Ref Range   Glucose-Capillary 181 (H) 65 - 99 mg/dL  Glucose, capillary     Status: Abnormal   Collection Time: 08/23/14  6:42 AM  Result Value Ref Range   Glucose-Capillary 156 (H) 65 - 99 mg/dL     HEENT: normal Cardio: RRR and no murmur Resp: CTA B/L and unlabored GI: BS positive and NT, ND Extremity:  Pulses positive and No Edema Skin:   Intact Neuro: Alert/Oriented, Normal Sensory, Abnormal Motor 4/5 on RIght side , 5/5 on Left and Abnormal FMC Ataxic/ dec FMC Musc/Skel:  Other minimal Left lateral knee tenderness Gen NAD   Assessment/Plan: 1. 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 Physiatrist is providing close team supervision and 24 hour management of active medical problems listed below. Physiatrist and rehab team continue to assess barriers to discharge/monitor patient progress toward functional and medical goals. FIM:    Function- Upper Body Dressing/Undressing What is the patient wearing?: Thread/unthread right sleeve of pullover shirt/dresss, Thread/unthread  left sleeve of pullover shirt/dress, Put head through opening of pull over shirt/dress, Pull shirt over trunk Function - Lower Body Dressing/Undressing Lower body dressing/undressing activity did not occur: 4: Steadying Assist What is the patient wearing?: Thread/unthread right pants leg, Thread/unthread left pants leg, Pull pants up/down, Don/Doff left sock, Don/Doff left shoe, Don/Doff right sock, Don/Doff right shoe, Fasten/unfasten right shoe, Fasten/unfasten left shoe  Function - Toileting Toileting steps completed by patient: Adjust clothing prior to toileting, Performs perineal hygiene, Adjust clothing after  toileting Toileting Assistive Devices: Grab bar or rail for support  Function - Archivist transfer assistive device: Elevated toilet seat, Grab bars  Function - Chair/bed transfer Chair/bed transfer assistive device: Armed forces operational officer - Comprehension Comprehension: Auditory  Function - Expression Expression: Verbal           Medical Problem List and Plan: 1. Functional deficits secondary to left basal ganglia/corona radiata infarct 2. DVT Prophylaxis/Anticoagulation: Pharmaceutical: Lovenox--indicated due to immobility, Plavix for CVA 3. Pain Management: N/A.  4. Mood: Team to provide ego support. LCSW to follow for evaluation and support.  5. Neuropsych: This patient is capable of making decisions on his own behalf. 6. Skin/Wound Care: Routine pressure relief measures. Maintain adequate fluid and nutritional intake.  7. Fluids/Electrolytes/Nutrition: Monitor I/O.80-100% meals   fluids 8. DM type 2: Poorly controlled due to dietary non-compliance. Will get RD to help with diet education. Will monitor BS with ac/hs CBG checks. Increase levemir at bedtime with SSI for tighter blood sugar control. Continue to hold metformin due to renal insufficiency CKD. On glucotrol Qam for daytime CBG, increase to 5mg  in am, improving CBGs, hypo glycemic episode, 8/12, change glucotrol to xl 9. HTN: Monitor BP every 8 hours. Continue losartan (max dose), coreg (max dose)and hytrin (max dose) . Titrate medications , increase hytrin to 10mg . am systolics are now in a better range ~130s-140s, ortho BPs ok will d/c   11. CKD?: 12. Dyslipidemia: continue Crestor. No side effects 13. Low protein stores: Will add prosource supplements.  14.  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

## 2014-08-23 NOTE — Progress Notes (Signed)
Occupational Therapy Session Note  Patient Details  Name: George Stevens MRN: [id number] Date of Birth: [birth year]  Today's Date: 08/23/2014 OT Individual Time: 0903-1000 OT Individual Time Calculation (min): 57 min    Short Term Goals: Week 2:  OT Short Term Goal 1 (Week 2): Continue to work on modified independent to supervision level goals.   Skilled Therapeutic Interventions/Progress Updates:    Pt worked on donning socks and shoes to begin session.  He was able to utilize the sockaide to start session with supervision.  Max assist needed to donn right shoe and soft AFO.  Worked on problem solving how to donn but pt will likely need assistance from spouse for this.  He progressed to standing at the sink to complete shaving task with electric razor.  Min guard assist for completion with pt using the RUE the entire time.  Next had pt ambulate to the gym with min hand held assist.  Pt mostly min guard assist but did exhibit one significant LOB to the right once in the gym requiring mod assist to correct.  Had pt stand to use UE ergonometer for 2 sets of 4 mins each.  First set pt used both UEs to peddle forward for 3 mins and then completed the final minute with use of just the RUE.  Resistance on level 5.  Pt needing max subtle cueing to keep his right knee from flexing while standing.  Second set had pt peddle backwards for 2 mins using just the RUE and then forward for 2 mins using the RUE.  Resistance changed to level 1.  Pt able to complete with min guard assist in standing.  Ambulated back to room with min assist to complete session.     Therapy Documentation Precautions:  Precautions Precautions: Fall Restrictions Weight Bearing Restrictions: No  Pain: Pain Assessment Pain Assessment: No/denies pain ADL:  Function:   Eating Eating               Grooming Oral Care,Brush Teeth, Clean Dentures Activity:             Wash, Rinse, Dry Face Activity          Wash,  Rinse, Dry Hands Activity          Brush, Comb Hair Activity        Shave Activity   Assist Level: Supervision or verbal cues      Apply Makeup Activity                                                             Bathing Bathing position      Bathing parts      Bathing assist         Upper Body Dressing/Undressing Upper body dressing   What is the patient wearing?: Pull over shirt/dress                Upper body assist         Lower Body Dressing/Undressing Lower body dressing   What is the patient wearing?: Shoes;Socks;AFO             Socks - Performed by patient: Don/doff right sock;Don/doff left sock (used sockaide)   Shoes - Performed by patient: Fasten left;Don/doff left shoe Shoes - Performed by helper: Don/doff  left shoe;Fasten left (Pt needed assistance with AFO)   AFO - Performed by helper: Don/doff right AFO      Lower body assist         Toileting Toileting          Toileting assist      Bed Mobility Roll left and right activity      Sit to lying activity      Lying to sitting activity      Mobility details     Transfers Sit to stand transfer   Sit to stand assist level: Supervision or verbal cues    Chair/bed transfer   Chair/bed transfer method: Ambulatory Chair/bed transfer assist level: Touching or steadying assistance (Pt > 75%)     Chair/bed transfer details: Manual facilitation for weight shifting  Toilet transfer                Tub/shower transfer             Cognition Comprehension Comprehension assist level: Follows complex conversation/direction with no assist  Expression Expression assist level: Expresses complex 90% of the time/cues < 10% of the time  Social Interaction Social Interaction assist level: Interacts appropriately with others - No medications needed.  Problem Solving Problem solving assist level: Solves complex 90% of the time/cues < 10% of the time  Memory Memory assist level:  Requires cues to use assistive device    Therapy/Group: Individual Therapy  Rami Waddle OTR/L 08/23/2014, 11:28 AM

## 2014-08-24 ENCOUNTER — Inpatient Hospital Stay (HOSPITAL_COMMUNITY): Payer: Medicare HMO | Admitting: Rehabilitation

## 2014-08-24 ENCOUNTER — Inpatient Hospital Stay (HOSPITAL_COMMUNITY): Payer: Medicare HMO | Admitting: Occupational Therapy

## 2014-08-24 ENCOUNTER — Inpatient Hospital Stay (HOSPITAL_COMMUNITY): Payer: Medicare HMO

## 2014-08-24 LAB — BASIC METABOLIC PANEL
ANION GAP: 12 (ref 5–15)
BUN: 38 mg/dL — ABNORMAL HIGH (ref 6–20)
CALCIUM: 9.1 mg/dL (ref 8.9–10.3)
CHLORIDE: 105 mmol/L (ref 101–111)
CO2: 19 mmol/L — AB (ref 22–32)
Creatinine, Ser: 2.15 mg/dL — ABNORMAL HIGH (ref 0.61–1.24)
GFR calc non Af Amer: 28 mL/min — ABNORMAL LOW (ref >60)
GFR, EST AFRICAN AMERICAN: 33 mL/min — AB (ref >60)
Glucose, Bld: 143 mg/dL — ABNORMAL HIGH (ref 65–99)
POTASSIUM: 4.2 mmol/L (ref 3.5–5.1)
Sodium: 136 mmol/L (ref 135–145)

## 2014-08-24 LAB — GLUCOSE, CAPILLARY
GLUCOSE-CAPILLARY: 243 mg/dL — AB (ref 65–99)
Glucose-Capillary: 139 mg/dL — ABNORMAL HIGH (ref 65–99)
Glucose-Capillary: 204 mg/dL — ABNORMAL HIGH (ref 65–99)
Glucose-Capillary: 204 mg/dL — ABNORMAL HIGH (ref 65–99)

## 2014-08-24 NOTE — Progress Notes (Signed)
Social Work Patient ID: George Stevens, male   DOB: February 18, 1940, 74 y.o.   MRN: [id number]   CSW received phone call from [facility] case manager, Karel Jarvis, to inform us of continued authorization of pt's stay through 08-25-14, with d/c on 08-26-14.  Informed pt, as he was concerned about this.  Also updated pt on team conference discussion and that therapists would like to do more family education with pt's wife.  CSW left a message for pt's wife to see if she could do this tomorrow and await return call.  Also scheduled pt for outpt PT at his request at [facility].  Rolling walker ordered to be delivered to the room.  CSW will continue to be available.

## 2014-08-24 NOTE — Progress Notes (Signed)
Subjective/Complaints: Pt feels good, Wondering about d/c prior to 8/20 ROS- neg CP, -SOB,- Abd pain, appetite good  Objective: Vital Signs: Blood pressure 130/56, pulse 75, temperature 97.6 F (36.4 C), temperature source Oral, resp. rate 20, height _0  (1.651 m), weight 89.177 kg (196 lb 9.6 oz), SpO2 98 %. No results found. Results for orders placed or performed during the hospital encounter of 08/12/14 (from the past 72 hour(s))  Glucose, capillary     Status: Abnormal   Collection Time: 08/21/14 11:40 AM  Result Value Ref Range   Glucose-Capillary 151 (H) 65 - 99 mg/dL  Glucose, capillary     Status: Abnormal   Collection Time: 08/21/14  4:43 PM  Result Value Ref Range   Glucose-Capillary 177 (H) 65 - 99 mg/dL  Glucose, capillary     Status: Abnormal   Collection Time: 08/21/14 10:15 PM  Result Value Ref Range   Glucose-Capillary 218 (H) 65 - 99 mg/dL  Glucose, capillary     Status: Abnormal   Collection Time: 08/22/14  6:48 AM  Result Value Ref Range   Glucose-Capillary 186 (H) 65 - 99 mg/dL  Glucose, capillary     Status: Abnormal   Collection Time: 08/22/14 12:09 PM  Result Value Ref Range   Glucose-Capillary 134 (H) 65 - 99 mg/dL  Glucose, capillary     Status: Abnormal   Collection Time: 08/22/14  4:29 PM  Result Value Ref Range   Glucose-Capillary 130 (H) 65 - 99 mg/dL  Glucose, capillary     Status: Abnormal   Collection Time: 08/22/14  8:47 PM  Result Value Ref Range   Glucose-Capillary 181 (H) 65 - 99 mg/dL  Glucose, capillary     Status: Abnormal   Collection Time: 08/23/14  6:42 AM  Result Value Ref Range   Glucose-Capillary 156 (H) 65 - 99 mg/dL  Glucose, capillary     Status: None   Collection Time: 08/23/14 11:49 AM  Result Value Ref Range   Glucose-Capillary 77 65 - 99 mg/dL  Glucose, capillary     Status: Abnormal   Collection Time: 08/23/14  4:26 PM  Result Value Ref Range   Glucose-Capillary 172 (H) 65 - 99 mg/dL  Glucose, capillary      Status: Abnormal   Collection Time: 08/23/14  8:45 PM  Result Value Ref Range   Glucose-Capillary 164 (H) 65 - 99 mg/dL   Comment 1 Notify RN   Glucose, capillary     Status: Abnormal   Collection Time: 08/24/14  6:43 AM  Result Value Ref Range   Glucose-Capillary 139 (H) 65 - 99 mg/dL   Comment 1 Notify RN      HEENT: normal Cardio: RRR and no murmur Resp: CTA B/L and unlabored GI: BS positive and NT, ND Extremity:  Pulses positive and No Edema Skin:   Intact Neuro: Alert/Oriented, Normal Sensory, Abnormal Motor 4/5 on RIght side , 5/5 on Left and Abnormal FMC Ataxic/ dec FMC Musc/Skel:  Other minimal Left lateral knee tenderness Gen NAD   Assessment/Plan: 1. 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 Physiatrist is providing close team supervision and 24 hour management of active medical problems listed below. Physiatrist and rehab team continue to assess barriers to discharge/monitor patient progress toward functional and medical goals.  Team conference today please see physician documentation under team conference tab, met with team face-to-face to  discuss problems,progress, and goals. Formulized individual treatment plan based on medical history, underlying problem and comorbidities. FIM:    Function- Upper Body Dressing/Undressing What is the patient wearing?: Pull over shirt/dress Function - Lower Body Dressing/Undressing Lower body dressing/undressing activity did not occur: 4: Steadying Assist What is the patient wearing?: Shoes, Socks, AFO Socks - Performed by patient: Don/doff right sock, Don/doff left sock (used sockaide) Shoes - Performed by patient: Fasten left, Don/doff left shoe Shoes - Performed by helper: Don/doff left shoe, Fasten left (Pt needed assistance with AFO) AFO - Performed by helper:  Don/doff right AFO  Function - Toileting Toileting steps completed by patient: Adjust clothing prior to toileting, Performs perineal hygiene, Adjust clothing after toileting Toileting Assistive Devices: Grab bar or rail for support  Function - Air cabin crew transfer assistive device: Elevated toilet seat, Grab bars  Function - Chair/bed transfer Chair/bed transfer method: Ambulatory Chair/bed transfer assist level: Touching or steadying assistance (Pt > 75%) Chair/bed transfer assistive device: Walker Chair/bed transfer details: Manual facilitation for weight shifting  Function - Locomotion: Wheelchair Will patient use wheelchair at discharge?: No Type: Manual Max wheelchair distance: 150 Assist Level: Supervision or verbal cues Assist Level: Supervision or verbal cues Assist Level: Supervision or verbal cues Function - Locomotion: Ambulation Assistive device: Walker-rolling Max distance: 250 Assist level: Supervision or verbal cues Assist level: Supervision or verbal cues Assist level: Supervision or verbal cues  Function - Comprehension Comprehension: Auditory Comprehension assist level: Follows complex conversation/direction with no assist  Function - Expression Expression: Verbal Expression assist level: Expresses complex 90% of the time/cues < 10% of the time  Function - Social Interaction Social Interaction assist level: Interacts appropriately with others - No medications needed.  Function - Problem Solving Problem solving assist level: Solves basic 90% of the time/requires cueing < 10% of the time  Function - Memory Memory assist level: Recognizes or recalls 90% of the time/requires cueing < 10% of the time  Medical Problem List and Plan: 1. Functional deficits secondary to left basal ganglia/corona radiata infarct 2. DVT Prophylaxis/Anticoagulation: Pharmaceutical: Lovenox--indicated due to immobility, Plavix for CVA 3. Pain Management: N/A.  4.  Mood: Team to provide ego support. LCSW to follow for evaluation and support.  5. Neuropsych: This patient is capable of making decisions on his own behalf. 6. Skin/Wound Care: Routine pressure relief measures. Maintain adequate fluid and nutritional intake.  7. Fluids/Electrolytes/Nutrition: Monitor I/O.90-100% meals   784m fluids 8. DM type 2: Poorly controlled due to dietary non-compliance. Will get RD to help with diet education. Will monitor BS with ac/hs CBG checks. Increase levemir at bedtime with SSI for tighter blood sugar control. Continue to hold metformin due to renal insufficiency CKD. On glucotrol Qam for daytime CBG, increase to 580min am, improving CBGs,recent CBGs <200, cont glucotrol XL 9. HTN: Monitor BP every 8 hours. Continue losartan (max dose), coreg (max dose)and hytrin (max dose) . Titrate medications , increase hytrin to 1061mam systolics are now in a better range ~130s-140s, no dizziness   11. CKD?: 12. Dyslipidemia: continue Crestor. No side effects 13. Low protein stores: Will add prosource supplements.  14.  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

## 2014-08-24 NOTE — Progress Notes (Signed)
Recreational Therapy Discharge Summary Patient Details  Name: George Stevens MRN: [id number] Date of Birth: 1940/01/20 Today's Date: 08/24/2014  Long term goals set: 1  Long term goals met: 1  Comments on progress toward goals: Pt has made good progress during LOS, met goal for community reintegration and is being discharged from TR services. Pt participated in an outing to the grocery store with close supervision-contact guard assist ambulatory level.  Education provided on energy conservation techniques and safe mobility during community pursuits.  Pt is anxious for upcoming discharge date.  Reasons for discharge: treatment goals met  Patient/family agrees with progress made and goals achieved: Yes  Amadeo Coke 08/24/2014, 11:14 AM

## 2014-08-24 NOTE — Progress Notes (Signed)
Physical Therapy Session Note  Patient Details  Name: George Stevens MRN: [id number] Date of Birth: 12/21/1940  Today's Date: 08/24/2014 PT Individual Time: 1300-1400 PT Individual Time Calculation (min): 60 min   Short Term Goals: Week 1:  PT Short Term Goal 1 (Week 1): Pt will perform supine to edge of bed. edge of bed to supine with mod I PT Short Term Goal 1 - Progress (Week 1): Met PT Short Term Goal 2 (Week 1): Pt will transfer bed to chair, chair to bed with min A.  PT Short Term Goal 2 - Progress (Week 1): Met PT Short Term Goal 3 (Week 1): Pt will ambulate with rolling walker and min A about 150 feet.  PT Short Term Goal 3 - Progress (Week 1): Met PT Short Term Goal 4 (Week 1): Pt will propel w/c about 150 feet with S.  PT Short Term Goal 4 - Progress (Week 1): Discontinued (comment) (due to ambulatory status) PT Short Term Goal 5 (Week 1): Pt will ascend/descend 12 stairs with 1 rail and min A.  PT Short Term Goal 5 - Progress (Week 1): Met  Skilled Therapeutic Interventions/Progress Updates:   Pt received sitting in recliner in room, agreeable to therapy session.  Note pt states that he has spoken with CSW regarding D/C plans with either tomorrow after therapy or Friday morning.  Will speak with primary PT regarding this manner.  Ambulated to/from therapy gym with RW and R foot up  Brace at S level with min cues for posture and increased hip activation.  Once in therapy gym, continue to address hip strength and balance with 4 way hip exercise with orange theraband.  While performing x 10 reps BLE each, had pt lightly hold with single UE for balance for increased balance challenge.  Provided facilitation at R hip during L exercise for increased R hip protraction and extension as he tends to want to keep hip protracted.  Continued to work on R hip protraction with resisted gait with pts hands on PT shoulders while PT provided resistance at B ASIS to increase protraction.  Tolerated  well with cues for upright posture, esp at trunk to decrease forward lean.   Progressed to R LE only bridging off of mat to continue to focus on increased glute strength.  Performed 2 sets of 10 reps.  Ended session with high level balance and gait activity while kicking yoga block.  Requires up to mod A at times with cues for slower speed and slower controlled movement to improve amount of WB and increased balance.  Performed x 2 reps of 50'.  Ambulated back to room and left in recliner with all needs in reach.   Therapy Documentation Precautions:  Precautions Precautions: Fall Restrictions Weight Bearing Restrictions: No Therapy Vitals Temp: 97.6 F (36.4 C) Temp Source: Oral Pulse Rate: 70 Resp: 17 BP: 100/73 mmHg Patient Position (if appropriate): Sitting Oxygen Therapy SpO2: 95 % O2 Device: Not Delivered Pain: Pt with no c/o pain during session.      Function:   Transfers Sit to stand transfer   Sit to stand assist level: Supervision or verbal cues Sit to stand assistive device: Walker;Armrests  Chair/bed transfer   Chair/bed transfer method: Ambulatory Chair/bed transfer assist level: Supervision or verbal cues Chair/bed transfer assistive device: Walker   Chair/bed transfer details: Verbal cues for technique;Verbal cues for Archivist transfer  Locomotion Ambulation   Assistive device: Walker-rolling Max distance: 100 Assist level: Supervision or verbal cues  Walk 10 feet activity   Assist level: Supervision or verbal cues  Walk 50 feet with 2 turns activity   Assist level: Supervision or verbal cues  Walk 150 feet activity   Assist level: Moderate assist (Pt 50 - 74%)  Walk 10 feet on uneven surfaces activity      Stairs   Stairs assistive device: 1 hand rail Max number of stairs: 12 Stairs assist level: Touching or steadying assistance (Pt > 75%)  Walk up/down 1 step activity        Walk up/down 4  steps activity      Walk up/down 12 steps activity      Pick up small objects from floor      Wheelchair          Wheel 50 feet with 2 turns activity      Wheel 150 feet activity       Cognition Comprehension Comprehension assist level: Follows complex conversation/direction with no assist  Expression Expression assist level: Expresses complex 90% of the time/cues < 10% of the time  Social Interaction Social Interaction assist level: Interacts appropriately with others - No medications needed.  Problem Solving Problem solving assist level: Solves basic 90% of the time/requires cueing < 10% of the time  Memory Memory assist level: Recognizes or recalls 90% of the time/requires cueing < 10% of the time    Therapy/Group: Individual Therapy  Denice Bors 08/24/2014, 3:32 PM

## 2014-08-24 NOTE — Progress Notes (Signed)
Occupational Therapy Session Note  Patient Details  Name: George Stevens MRN: [id number] Date of Birth: 09-19-1940  Today's Date: 08/24/2014 OT Individual Time: 0901-1030 OT Individual Time Calculation (min): 89 min    Short Term Goals: Week 2:  OT Short Term Goal 1 (Week 2): Continue to work on modified independent to supervision level goals.   Skilled Therapeutic Interventions/Progress Updates:    Pt performed shower and dressing tasks.  Pt still needing assistance to donn the right shoe and AFO.  Increased LOB at times to the right side with mobility in the room.  Pt still with decreased ability to advance the RLE as well as maintaining the right knee in extension.  Worked on neuromuscular re-education during session as well in standing to increase RLE stability in the hip and knee as well.  Increased reaching down to the floor from standing position to pick up clothespins with the RUE and place on vertical grid in standing.  Min assist to keep from falling to the right after he fatigues.  Worked on stepping up on step with the LUE while maintaining balance on the RLE.  Increased LOB to the right as well as decreased ability to maintain left knee extension as well as left hip extension.  Pt tends to fall into trunk flexion with these attempts unless facilitation is provided.  Finished session with dowel rod exercises sitting unsupported.  Pt still with slight right elbow flexion during shoulder flexion and decreased AROM by 10 degrees compared to the LUE.  Performed 2 sets of 10 repetitions with min instructional cueing for technique and maintaining upright sitting.    Therapy Documentation Precautions:  Precautions Precautions: Fall Restrictions Weight Bearing Restrictions: No  Pain: Pain Assessment Pain Assessment: No/denies pain ADL:   Function:   Eating Eating               Grooming Oral Care,Brush Teeth, Clean Dentures Activity:             Wash, Rinse, Dry Face  Activity   Assist Level: Supervision or verbal cues      Wash, Rinse, Dry Hands Activity          Brush, Comb Hair Activity        Shave Activity          Apply Makeup Activity                                                             Bathing Bathing position   Position: Shower  Bathing parts Body parts bathed by patient: Right arm;Left arm;Chest;Abdomen;Front perineal area;Buttocks;Right upper leg;Left upper leg;Right lower leg;Left lower leg    Bathing assist Assist Level: Touching or steadying assistance(Pt > 75%)       Upper Body Dressing/Undressing Upper body dressing         Pull over shirt/dress - Perfomed by patient: Thread/unthread right sleeve;Thread/unthread left sleeve;Put head through opening;Pull shirt over trunk          Upper body assist         Lower Body Dressing/Undressing Lower body dressing   What is the patient wearing?: Pants;Socks;Shoes;AFO     Pants- Performed by patient: Thread/unthread right pants leg;Thread/unthread left pants leg       Socks - Performed by patient: Don/doff left  sock;Don/doff right sock   Shoes - Performed by patient: Don/doff left shoe;Fasten left Shoes - Performed by helper: Don/doff right shoe;Fasten right   AFO - Performed by helper: Don/doff right AFO      Lower body assist         Toileting Toileting          Toileting assist      Bed Mobility Roll left and right activity      Sit to lying activity      Lying to sitting activity      Mobility details     Transfers Sit to stand transfer   Sit to stand assist level: Touching or steadying assistance (Pt > 75%/lift 1 leg)    Chair/bed transfer   Chair/bed transfer method: Ambulatory Chair/bed transfer assist level: Touching or steadying assistance (Pt > 75%)        Toilet transfer                Tub/shower transfer             Cognition Comprehension    Expression    Social Interaction    Problem Solving     Memory      Therapy/Group: Individual Therapy  Burris Matherne OTR/L 08/24/2014, 11:06 AM

## 2014-08-24 NOTE — Discharge Instructions (Signed)
Inpatient Rehab Discharge Instructions  George Stevens Discharge date and time:    Activities/Precautions/ Functional Status: Activity: activity as tolerated. No driving Diet: cardiac diet and diabetic diet Wound Care: none needed   Functional status:  ___ No restrictions     ___ Walk up steps independently ___ 24/7 supervision/assistance   ___ Walk up steps with assistance ___ Intermittent supervision/assistance  ___ Bathe/dress independently _X__ Walk with walker    ___ Bathe/dress with assistance ___ Walk Independently    ___ Shower independently _X__ Walk with assistance    ___ Shower with assistance _X__ No alcohol     ___ Return to work/school ________  COMMUNITY REFERRALS UPON DISCHARGE:   Outpatient: PT  Agency:  [facility] Phone:  [phone]   Appointment Date/Time:  08-30-14 @ 10 AM Medical Equipment/Items Ordered:  Rolling walker  Agency/Supplier:  [facility]     Phone:  [phone]  GENERAL COMMUNITY RESOURCES FOR PATIENT/FAMILY: Support Groups:  [facility] Stroke Support Group                              Meets every 3rd Sunday of the month (except for June, July, and August) at 3 PM                              In the dayroom on [address] [facility]                               Special Instructions:  STROKE/TIA DISCHARGE INSTRUCTIONS SMOKING Cigarette smoking nearly doubles your risk of having a stroke & is the single most alterable risk factor  If you smoke or have smoked in the last 12 months, you are advised to quit smoking for your health.  Most of the excess cardiovascular risk related to smoking disappears within a year of stopping.  Ask you doctor about anti-smoking medications  [place] Quit Line: 1-800-QUIT NOW  Free Smoking Cessation Classes [phone]  CHOLESTEROL Know your levels; limit fat & cholesterol in your diet  Lipid Panel     Component Value Date/Time   CHOL 160  08/10/2014 0238   TRIG 177* 08/10/2014 0238   HDL 34* 08/10/2014 0238   CHOLHDL 4.7 08/10/2014 0238   VLDL 35 08/10/2014 0238   LDLCALC 91 08/10/2014 0238      Many patients benefit from treatment even if their cholesterol is at goal.  Goal: Total Cholesterol (CHOL) less than 160  Goal:  Triglycerides (TRIG) less than 150  Goal:  HDL greater than 40  Goal:  LDL (LDLCALC) less than 100   BLOOD PRESSURE American Stroke Association blood pressure target is less that 120/80 mm/Hg  Your discharge blood pressure is:  BP: (!) 130/56 mmHg  Monitor your blood pressure  Limit your salt and alcohol intake  Many individuals will require more than one medication for high blood pressure  DIABETES (A1c is a blood sugar average for last 3 months) Goal HGBA1c is under 7% (HBGA1c is blood sugar average for last 3 months)  Diabetes:     Lab Results  Component Value Date   HGBA1C 10.7* 08/10/2014     Your HGBA1c can be lowered with medications, healthy diet, and exercise.  Check your blood sugar as directed by your physician  Call your physician if you experience unexplained or low blood sugars.  PHYSICAL ACTIVITY/REHABILITATION Goal is 30 minutes at least 4 days per week  Activity: No driving, Therapies:  See above Return to work:  N/A  Activity decreases your risk of heart attack and stroke and makes your heart stronger.  It helps control your weight and blood pressure; helps you relax and can improve your mood.  Participate in a regular exercise program.  Talk with your doctor about the best form of exercise for you (dancing, walking, swimming, cycling).  DIET/WEIGHT Goal is to maintain a healthy weight  Your discharge diet is: Diet Carb Modified Fluid consistency:: Thin; Room service appropriate?: Yes  liquids Your height is:  Height:  (165.1 cm) Your current weight is: Weight: 89.177 kg (196 lb 9.6 oz) Your Body Mass Index (BMI) is:  BMI (Calculated): 31.2  Following the  type of diet specifically designed for you will help prevent another stroke.  Your goal weight is:  150 lbs  Your goal Body Mass Index (BMI) is 19-24.  Healthy food habits can help reduce 3 risk factors for stroke:  High cholesterol, hypertension, and excess weight.  RESOURCES Stroke/Support Group:  Call [phone]   STROKE EDUCATION PROVIDED/REVIEWED AND GIVEN TO PATIENT Stroke warning signs and symptoms How to activate emergency medical system (call 911). Medications prescribed at discharge. Need for follow-up after discharge. Personal risk factors for stroke. Pneumonia vaccine given:  Flu vaccine given:  My questions have been answered, the writing is legible, and I understand these instructions.  I will adhere to these goals & educational materials that have been provided to me after my discharge from the hospital.      My questions have been answered and I understand these instructions. I will adhere to these goals and the provided educational materials after my discharge from the hospital.  Patient/Caregiver Signature _______________________________ Date __________  Clinician Signature _______________________________________ Date __________  Please bring this form and your medication list with you to all your follow-up doctor's appointments.

## 2014-08-24 NOTE — Progress Notes (Signed)
Physical Therapy Session Note  Patient Details  Name: George Stevens MRN: [id number] Date of Birth: [birth year]  Today's Date: 08/24/2014 PT Individual Time: 0800-0900 PT Individual Time Calculation (min): 60 min   Short Term Goals: Week 2:  PT Short Term Goal 1 (Week 2): STGs = LTGs due to ELOS  Skilled Therapeutic Interventions/Progress Updates:  Gait with RW x 200' on carpet and level tile with min assist>supervision.  When passing through doorwy to stairwell, pt had LOB requiring mod assist to correct, with minimal awareness of LOB. Up/down 12 steps R rail, step through when ascending close supervision, step to when descending min assist.  Pt continues to need mod cues for safe hand placement sit><stand with RW.    Floor transfer with supervision and cues after demo.  Discussed falls risk and use of 911 PRN.  neuromuscular re-education via hand out of [place] A exs, manual cues, VCs for 10 x 1 each standing calf /toe raises, mini squats, hip abd R/L, hamstring curls R/L; sitting long arc quad knee ext with 2# R/L ankles. Gait carrying folded RW x 200' with min assist> close supervision without LOB.  Basic transfers with close supervision to recliner, mat, using RW. Therapy Documentation Precautions:  Precautions Precautions: Fall Restrictions Weight Bearing Restrictions: No  Pain: Pain Assessment Pain Assessment: No/denies pain      Function:  Toileting Toileting       Bed Mobility Roll left and right activity      Sit to lying activity      Lying to sitting activity      Mobility details     Transfers Sit to stand transfer   Sit to stand assist level: Touching or steadying assistance (Pt > 75%/lift 1 leg)    Chair/bed transfer   Chair/bed transfer method: Ambulatory Chair/bed transfer assist level: Supervision or verbal cues Chair/bed transfer assistive device: Walker   Chair/bed transfer details: Verbal cues for technique;Verbal cues for precautions/safety  (hand placement)   Copy device: Walker-rolling Max distance: 200 Assist level: Moderate assist (Pt 50 - 74%)  Walk 10 feet activity      Walk 50 feet with 2 turns activity      Walk 150 feet activity   Assist level: Moderate assist (Pt 50 - 74%)  Walk 10 feet on uneven surfaces activity      Stairs   Stairs assistive device: 1 hand rail Max number of stairs: 12 Stairs assist level: Touching or steadying assistance (Pt > 75%)  Walk up/down 1 step activity        Walk up/down 4 steps activity      Walk up/down 12 steps activity      Pick up small objects from floor      Wheelchair          Wheel 50 feet with 2 turns activity      Wheel 150 feet activity       Cognition Comprehension    Expression    Social Interaction    Problem Solving    Memory Memory assist level: Recognizes or recalls 25 - 49% of the time/requires cueing 50 - 75% of the time    Therapy/Group: Individual Therapy  Chad Tiznado 08/24/2014, 12:41 PM

## 2014-08-25 ENCOUNTER — Inpatient Hospital Stay (HOSPITAL_COMMUNITY): Payer: Medicare HMO | Admitting: Occupational Therapy

## 2014-08-25 ENCOUNTER — Encounter (HOSPITAL_COMMUNITY): Payer: Medicare HMO | Admitting: Occupational Therapy

## 2014-08-25 ENCOUNTER — Inpatient Hospital Stay (HOSPITAL_COMMUNITY): Payer: Medicare HMO

## 2014-08-25 DIAGNOSIS — N189 Chronic kidney disease, unspecified: Secondary | ICD-10-CM | POA: Diagnosis present

## 2014-08-25 LAB — GLUCOSE, CAPILLARY
GLUCOSE-CAPILLARY: 78 mg/dL (ref 65–99)
Glucose-Capillary: 131 mg/dL — ABNORMAL HIGH (ref 65–99)

## 2014-08-25 LAB — BASIC METABOLIC PANEL
ANION GAP: 11 (ref 5–15)
BUN: 33 mg/dL — AB (ref 6–20)
CALCIUM: 9.4 mg/dL (ref 8.9–10.3)
CO2: 24 mmol/L (ref 22–32)
Chloride: 103 mmol/L (ref 101–111)
Creatinine, Ser: 2.09 mg/dL — ABNORMAL HIGH (ref 0.61–1.24)
GFR calc Af Amer: 34 mL/min — ABNORMAL LOW (ref >60)
GFR, EST NON AFRICAN AMERICAN: 29 mL/min — AB (ref >60)
GLUCOSE: 114 mg/dL — AB (ref 65–99)
Potassium: 4.2 mmol/L (ref 3.5–5.1)
SODIUM: 138 mmol/L (ref 135–145)

## 2014-08-25 MED ORDER — INSULIN DETEMIR 100 UNIT/ML ~~LOC~~ SOLN: 50.0000 [IU] | Freq: Every day | SUBCUTANEOUS | Status: AC

## 2014-08-25 MED ORDER — TERAZOSIN HCL 10 MG PO CAPS: 10.0000 mg | ORAL_CAPSULE | Freq: Every day | ORAL | Status: AC

## 2014-08-25 MED ORDER — INSULIN LISPRO 100 UNIT/ML ~~LOC~~ SOLN: 10.0000 [IU] | Freq: Three times a day (TID) | SUBCUTANEOUS | Status: DC

## 2014-08-25 MED ORDER — GLIPIZIDE 5 MG PO TABS: 5.0000 mg | ORAL_TABLET | Freq: Every day | ORAL | Status: DC

## 2014-08-25 MED ORDER — ATORVASTATIN CALCIUM 80 MG PO TABS: 40.0000 mg | ORAL_TABLET | Freq: Every day | ORAL | Status: AC

## 2014-08-25 MED ORDER — LOSARTAN POTASSIUM 100 MG PO TABS: 100.0000 mg | ORAL_TABLET | Freq: Every day | ORAL | Status: AC

## 2014-08-25 MED ORDER — CLOPIDOGREL BISULFATE 75 MG PO TABS: 75.0000 mg | ORAL_TABLET | Freq: Every day | ORAL | Status: AC

## 2014-08-25 MED ORDER — INSULIN LISPRO 100 UNIT/ML ~~LOC~~ SOLN: 15.0000 [IU] | Freq: Three times a day (TID) | SUBCUTANEOUS | Status: DC

## 2014-08-25 MED ORDER — CARVEDILOL 25 MG PO TABS: 25.0000 mg | ORAL_TABLET | Freq: Two times a day (BID) | ORAL | Status: AC

## 2014-08-25 NOTE — Progress Notes (Signed)
Social Work Discharge Note  The overall goal for the admission was met for:   Discharge location: Yes - home  Length of Stay: Yes - 13 days  Discharge activity level: Yes  Home/community participation: Yes - supervision  Services provided included: MD, RD, PT, OT, RN, TR, Pharmacy and [place]: Private Insurance: [place] [employer]  Follow-up services arranged: Outpatient: PT, DME: rollng walker and Patient/Family request agency HH: [facility] , DME: [facility]  Comments (or additional information):  CSW also gave pt/wife a signed handicap placard application.  Pt to go for outpt PT in [place] per pt's request.  Pt's wife came in for more training today prior to pt's d/c.  Both she and pt are excited for pt to come home.  Dtr-in-law will assist when she returns to town soon.    Patient/Family verbalized understanding of follow-up arrangements: Yes  Individual responsible for coordination of the follow-up plan: pt with his wife  Confirmed correct DME delivered: Trey Sailors 08/25/2014    Doyt Castellana, Silvestre Mesi

## 2014-08-25 NOTE — Progress Notes (Signed)
Patient stated that hs had been administering his levemir in the anterior thigh.  Writer explained to the patient that this form of insulin should be administered in the abdomen.

## 2014-08-25 NOTE — Discharge Summary (Signed)
Physician Discharge Summary  Patient ID: George Stevens MRN: [id number] DOB/AGE: 1940/12/14 74 y.o.  Admit date: 08/12/2014 Discharge date: 08/25/2014  Discharge Diagnoses:  Principal Problem:   Basal ganglia infarction Active Problems:   Right hemiparesis   Diabetes type 2, uncontrolled   Cellulitis of right upper extremity   Essential hypertension   Primary gout   CKD (chronic kidney disease)   Discharged Condition: Stable  Labs:  Basic Metabolic Panel:  Recent Labs Lab 08/24/14 1410 08/25/14 1034  NA 136 138  K 4.2 4.2  CL 105 103  CO2 19* 24  GLUCOSE 143* 114*  BUN 38* 33*  CREATININE 2.15* 2.09*  CALCIUM 9.1 9.4    CBC: CBC Latest Ref Rng 08/19/2014 08/13/2014 08/09/2014  WBC 4.0 - 10.5 K/uL 7.1 8.9 -  Hemoglobin 13.0 - 17.0 g/dL 12.9(L) 12.8(L) 14.6  Hematocrit 39.0 - 52.0 % 38.1(L) 38.2(L) 43.0  Platelets 150 - 400 K/uL 250 239 -     CBG:  Recent Labs Lab 08/24/14 0643 08/24/14 1156 08/24/14 1644 08/24/14 2052 08/25/14 0634  GLUCAP 139* 204* 204* 243* 131*    Brief HPI:   George Stevens is a 74 y.o. Right handed male with history of HTN, DM type 2, who was admitted on 08/09/2014 with right-sided weakness, facial droop and slurred speech. MRI of the head showed acute nonhemorrhagic perforator infarct on the left favoring anterior chorodial over lateral lenticulostriate distribution and bifrontal gliosis c/w remote cerebral contusions. MRA was negative for stenosis or occlusion. Carotid Dopplers without ICA stenosis. 2D echo with ejection fraction of 65% no wall motion abnormalities with grade 1 diastolic dysfunction. Patient did receive TPA and was started on Unasyn due to cellulitis from recent cat bite. . Dr. 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 Sleep study recommended on outpatient basis due to concerns of sleep apnea. Patient with resultant dysarthria as well as right  sided weakness, right lean with unsteady gait as well as difficulty with recall, problem solving and processing. CIR recommended for follow up therapy.    Hospital Course: Demarqus Jocson was admitted to rehab 08/12/2014 for inpatient therapies to consist of PT and OT at least three hours five days a week. Past admission physiatrist, therapy team and rehab RN have worked together to provide customized collaborative inpatient rehab. Blood pressures have been monitored on bid basis and medications were titrated to home dose. Serial labs were drawn for monitoring and he was noted to have acute on chronic renal failure with rise in BUN/Cr to 38/2.15. HCTZ was discontinued and he was advised to push fluids with some improvement in renal function. He is to follow up with PMD for repeat BMET in a week. He has had urinary frequency and UA/UCS was negative for infection and PVR check showed no signs of retention. Marland Kitchen  He is v Diabetes has been monitored on ac/hs basis and blood sugars are trending upward with improvement intake. Patient and wife have been educated on CM diet as well as compromise to help with compliance of diet. Meal coverage Novolog was increased to 15 units tid ac at discharge and patient has been advised on titration up to home dose depending on BS trends.  He has made steady progress and is at supervision level at discharge. He will continue to receive follow up Outpatient PT at [facility] after discharge.    Rehab course: During patient's stay in rehab weekly team conferences  were held to monitor patient's progress, set goals and discuss barriers to discharge. At admission, patient required mod assist with verbal cues for mobility and self care tasks. He has had improvement in activity tolerance, balance, postural control, as well as ability to compensate for deficits. He requires steady assist for bathing and lower body dressing.  He is has had improvement in functional use RUE  and RLE as well  as improved awareness. He requires supervision for transfers and ambulation. He is ambulating 100' with RW with cues for safety.    Disposition:  Home  Diet: Diabetic diet/ Low fat/Low cholesterol.   Special Instructions: 1. Drink plenty of fluids daily. 2. Check blood sugars before meals/bedtime.     Medication List    STOP taking these medications        aspirin 81 MG chewable tablet     losartan-hydrochlorothiazide 100-25 MG per tablet  Commonly known as:  HYZAAR     metFORMIN 1000 MG tablet  Commonly known as:  GLUCOPHAGE      TAKE these medications        atorvastatin 80 MG tablet  Commonly known as:  LIPITOR  Take 0.5 tablets (40 mg total) by mouth daily.     carvedilol 25 MG tablet  Commonly known as:  COREG  Take 1 tablet (25 mg total) by mouth 2 (two) times daily with a meal.     clopidogrel 75 MG tablet  Commonly known as:  PLAVIX  Take 1 tablet (75 mg total) by mouth daily.     glipiZIDE 5 MG tablet  Commonly known as:  GLUCOTROL  Take 1 tablet (5 mg total) by mouth daily before breakfast.     insulin detemir 100 UNIT/ML injection  Commonly known as:  LEVEMIR  Inject 0.5 mLs (50 Units total) into the skin at bedtime.     insulin lispro 100 UNIT/ML injection  Commonly known as:  HUMALOG  Inject 0.15 mLs (15 Units total) into the skin 3 (three) times daily before meals.  Notes to Patient:  Check your blood sugars before meals and at bedtime. If your blood sugars are running over 150 for 1-2 days increase by 5 units every 2 day to home dose of 25 units before meals.      losartan 100 MG tablet  Commonly known as:  COZAAR  Take 1 tablet (100 mg total) by mouth daily. Note that this is a new blood pressure medication     terazosin 10 MG capsule  Commonly known as:  HYTRIN  Take 1 capsule (10 mg total) by mouth at bedtime.       Follow-up Information    Follow up with Erick Colace, MD On 09/23/2014.   Specialty:  Physical Medicine and  Rehabilitation   Why:  BE there at 12:15 for 12:30 pm   Contact information:   [address] [place] [address] [phone]       Follow up with Xu,Jindong, MD. Call today.   Specialty:  Neurology   Why:  for appointment in 4 weeks.    Contact information:   [address] [place] [address] [phone]       Follow up with Cheral Bay, MD On 09/05/2014.   Specialty:  Family Medicine   Why:  @ 3 PM - arrive a few minutes early to check. Post hospital follow up and check of lytes.    Contact information:   [address] [facility] [address] [phone]       Signed: Jacquelynn Cree 08/25/2014, 11:31 AM

## 2014-08-25 NOTE — Progress Notes (Signed)
Physical Therapy Discharge Summary  Patient Details  Name: George Stevens MRN: [id number] Date of Birth: 1940/01/17  Today's Date: 08/25/2014 PT Individual Time:1030-1133;  1448-1856 PT Individual Time Calculation (min): 33, 45 min   Patient has met 5 of 6 long term goals due to improved activity tolerance, improved balance, improved postural control, increased strength, decreased pain, ability to compensate for deficits, functional use of  right upper extremity and right lower extremity and improved attention.  Patient to discharge at an ambulatory level Supervision.   Patient's care partner is independent to provide the necessary cognitive assistance at discharge.  Reasons goals not met: limited short term memory for safe hand placement sit><stand with RW  Recommendation:  Patient will benefit from ongoing skilled PT services in outpatient setting to continue to advance safe functional mobility, address ongoing impairments in balance, strength and motor control, activity tolerance, safety and minimize fall risk.  Equipment: RW, Foot Up brace R  Reasons for discharge: treatment goals met and discharge from hospital  Patient/family agrees with progress made and goals achieved: Yes  PT Discharge  Tx 1: family ed with wife for guarding pt : performing gait on level tile and carpet, transporting objects with RW, retrieving small object from floor, floor transfer with supervision x 2 ,discussion of falls risk and use of 911,  simulated car transfer with cues for hand placement, gait up/down (13) 7" steps,  L rail.  49 M timed test.  Wife initially reluctant to cue pt on safety concerns with pt ambulating too fast, not giving her space on R.   Tx 2: continued family ed with wife for gait in congested areas. Pt stated that he generally gets up to use BR 2-3x/night at home; PT discussed falls risk of this and recommended pt use urinal, sitting EOB.  Pt declined. Wife showed improvement this  session in cueing pt, and safely return demonstrated all mobility and gait.  Patient demonstrates increased fall risk as noted by score of  45 /56 on Berg Balance Scale.  (<36= high risk for falls, close to 100%; 37-45 significant >80%; 46-51 moderate >50%; 52-55 lower >25%).  Risks discussed again with pt and wife.  Normal TUG= 22.41.  Precautions/Restrictions Precautions Precautions: Fall Restrictions Weight Bearing Restrictions: No Vital Signs Therapy Vitals Temp: 98.5 F (36.9 C) Temp Source: Oral Pulse Rate: 63 Resp: 18 BP: 140/62 mmHg Patient Position (if appropriate): Sitting Oxygen Therapy SpO2: 99 % O2 Device: Not Delivered Pain- none reported   Vision/Perception NP per pt    Cognition Overall Cognitive Status: Within Functional Limits for tasks assessed Arousal/Alertness: Awake/alert Orientation Level: Oriented X4 Attention: Alternating Selective Attention: Appears intact Selective Attention Impairment: Functional basic;Functional complex Alternating Attention: Appears intact Memory: deficits for new information Awareness: Appears intact Problem Solving: Appears intact Safety/Judgment: Impaired Comments: Feel pt is a safety risk but likely not related to his new CVA.  Decreased understanding of the need for assistive device and supervision for mobility.  Sensation Sensation Light Touch: Appears Intact Stereognosis: Appears Intact Hot/Cold: Appears Intact Proprioception: Appears Intact Coordination Gross Motor Movements are Fluid and Coordinated: No Fine Motor Movements are Fluid and Coordinated: No Coordination and Movement Description: Pt still with slight limitations in RUE gross and FM coordination but uses functionally for selfcare tasks without difficulty.  Only slight ataxia noted with finger to nose testing when comparing the RUE with the LUE.   Heel Shin Test: = bil Motor  Motor Motor: Hemiplegia  Mobility Transfers Sit to Stand: 5:  Supervision Sit to Stand Details: Verbal cues for precautions/safety Stand to Sit: 5: Supervision Stand to Sit Details (indicate cue type and reason): Tactile cues for weight shifting Locomotion  Ambulation Ambulation: Yes Ambulation/Gait Assistance: 5: Supervision Assistive device: Rollator Ambulation/Gait Assistance Details: Verbal cues for precautions/safety Gait Gait: Yes Gait Pattern: Impaired Gait Pattern: Trunk rotated posteriorly on right;Poor foot clearance - right;Narrow base of support;Decreased dorsiflexion - right;Decreased hip/knee flexion - right Gait velocity: 2.18'/sec High Level Ambulation High Level Ambulation: Backwards walking Backwards Walking: min guard assist for narrow BOS Stairs / Additional Locomotion Stairs: Yes Stairs Assistance: 5: Supervision Stairs Assistance Details: Verbal cues for precautions/safety;Verbal cues for gait pattern Stair Management Technique: One rail Left Number of Stairs: 12 Height of Stairs: 7 Wheelchair Mobility Wheelchair Mobility: No  Trunk/Postural Assessment  Cervical Assessment Cervical Assessment: Within Functional Limits Thoracic Assessment Thoracic Assessment: Within Functional Limits Lumbar Assessment Lumbar Assessment: Within Functional Limits Postural Control Righting Reactions: delayed with LOB to R, pt still with knee flexion on the right without any awareness that it is occuring.  Balance Balance Balance Assessed: Yes Standardized Balance Assessment Standardized Balance Assessment: Berg Balance Test Berg Balance Test Sit to Stand: Able to stand without using hands and stabilize independently Standing Unsupported: Able to stand safely 2 minutes Sitting with Back Unsupported but Feet Supported on Floor or Stool: Able to sit safely and securely 2 minutes Stand to Sit: Sits safely with minimal use of hands Transfers: Able to transfer safely, minor use of hands Standing Unsupported with Eyes Closed: Able to  stand 10 seconds safely Standing Ubsupported with Feet Together: Able to place feet together independently and stand for 1 minute with supervision From Standing, Reach Forward with Outstretched Arm: Can reach confidently >25 cm (10") From Standing Position, Pick up Object from Floor: Able to pick up shoe, needs supervision From Standing Position, Turn to Look Behind Over each Shoulder: Looks behind from both sides and weight shifts well Turn 360 Degrees: Able to turn 360 degrees safely but slowly Standing Unsupported, Alternately Place Feet on Step/Stool: Able to complete 4 steps without aid or supervision Standing Unsupported, One Foot in Front: Able to take small step independently and hold 30 seconds Standing on One Leg: Tries to lift leg/unable to hold 3 seconds but remains standing independently Total Score: 45 Timed Up and Go Test TUG: Normal TUG Normal TUG (seconds): 22.41 Static Sitting Balance Static Sitting - Balance Support: Feet supported Static Sitting - Level of Assistance: 7: Independent Dynamic Sitting Balance Dynamic Sitting - Balance Support: Feet supported;Feet unsupported Dynamic Sitting - Level of Assistance: 6: Modified independent (Device/Increase time) Static Standing Balance Static Standing - Balance Support: No upper extremity supported Static Standing - Level of Assistance: 5: Stand by assistance Dynamic Standing Balance Dynamic Standing - Balance Support: During functional activity Dynamic Standing - Level of Assistance: 5: Stand by assistance Dynamic Standing - Balance Activities: Reaching across midline;Compliant surfaces Dynamic Standing - Comments: poor awareness of RLE placement Extremity Assessment  RUE Assessment RUE Assessment: Exceptions to [facility] (Pt with isolated RUE movement in all joints.  Slight decreased ful AROM shoulder flexion.  Currently 0-125 degrees presently.  Pt with decreased ability to maintain full elbow extension during shoulder flexion  as well.   ) LUE Assessment LUE Assessment: Within Functional Limits RLE Assessment RLE Assessment: Exceptions to [facility] RLE Strength RLE Overall Strength: Deficits RLE Overall Strength Comments: grossly in sitting: hip flex, knee flex, ankle DF 4-/5; knee ext 4+/5 LLE Assessment LLE Assessment:  Within Functional Limits LLE AROM (degrees) Overall AROM Left Lower Extremity: Within functional limits for tasks assessed LLE Strength LLE Overall Strength: Within Functional Limits for tasks assessed  Function:  Toileting Toileting       Bed Mobility Roll left and right activity   Assist level: No Help, No cues  Sit to lying activity   Assist level: No help, No cues, assistive device, takes more than a reasonable amount of time  Lying to sitting activity   Assist level: No Help, No cues  Mobility details     Transfers Sit to stand transfer   Sit to stand assist level: Supervision or verbal cues Sit to stand assistive device: Walker  Chair/bed transfer   Chair/bed transfer method: Ambulatory Chair/bed transfer assist level: Supervision or verbal cues Chair/bed transfer assistive device: Walker   Chair/bed transfer details: Verbal cues for technique;Verbal cues for precautions/safety   Toilet transfer   Toilet transfer assistive device: Elevated toilet seat/BSC over toilet    Geneticist, molecular transfer assistive device: Publishing rights manager transfer assist level: Supervision or verbal cues    Locomotion Ambulation   Assistive device: Walker-rolling Max distance: 150 Assist level: Supervision or verbal cues  Walk 10 feet activity   Assist level: Supervision or verbal cues  Walk 50 feet with 2 turns activity   Assist level: Supervision or verbal cues  Walk 150 feet activity   Assist level: Supervision or verbal cues  Walk 10 feet on uneven surfaces activity   Assist level: Touching or steadying assistance (Pt > 75%)  Stairs   Stairs assistive device: 1 hand rail   Stairs assist  level: Supervision or verbal cues  Walk up/down 1 step activity        Walk up/down 4 steps activity      Walk up/down 12 steps activity  close supervision, step through ascending, step to descending, with L rail    Pick up small objects from floor  min guard assist    Wheelchair          Wheel 50 feet with 2 turns activity      Wheel 150 feet activity       Cognition Comprehension Comprehension assist level: Follows complex conversation/direction with no assist  Expression Expression assist level: Expresses complex 90% of the time/cues < 10% of the time  Social Interaction Social Interaction assist level: Interacts appropriately with others - No medications needed.  Problem Solving Problem solving assist level: Solves basic 25 - 49% of the time - needs direction more than half the time to initiate, plan or complete simple activities  Memory Memory assist level: Recognizes or recalls 50 - 74% of the time/requires cueing 25 - 49% of the time    Sue Mcalexander 08/25/2014, 5:21 PM

## 2014-08-25 NOTE — Progress Notes (Signed)
Inpatient RehabilitationTeam Conference and Plan of Care Update Date: 08/24/2014   Time: 10:40 AM    Patient Name: George Stevens      Medical Record Number: [id number]  Date of Birth: 11-29-1940 Sex: Male         Room/Bed: 4M09C/4M09C-01 Payor Info: Payor: [employer] MED[employer]ARE / Plan: [employer] MEDICARE HMO/PPO / Product Type: *No Product type* /    Admitting Diagnosis: L CVA  Admit Date/Time:  08/12/2014  4:38 PM Admission Comments: No comment available   Primary Diagnosis:  Basal ganglia infarction Principal Problem: Basal ganglia infarction  Patient Active Problem List   Diagnosis Date Noted  . CKD (chronic kidney disease) 08/25/2014  . Right hemiparesis 08/12/2014  . 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 Diabetes type 2, uncontrolled   . Cellulitis of right upper extremity   . Essential hypertension   . Primary gout   . Basal ganglia infarction 08/09/2014    Expected Discharge Date: Expected Discharge Date: 08/26/14  Team Members Present: Physician leading conference: Dr. Claudette Laws Social Worker Present: Staci Acosta, LCSW Nurse Present: Carmie End, RN PT Present: Wanda Plump, PT;Other (comment) Dorcas Carrow, PT) OT Present: Perrin Maltese, Suszanne Conners, OT SLP Present: Jackalyn Lombard, SLP PPS Coordinator present : Tora Duck, RN, CRRN     Current Status/Progress Goal Weekly Team Focus  Medical   minG assist, Wife came in for 1 session needs more training, has puppy at home  Enc use of assitive device to maximize safety  d/c planning   Bowel/Bladder   continent of bowel and bladder, LBM 8/16  remain continent of bowel and bladder with min assist  monitor bowel and bladder function   Swallow/Nutrition/ Hydration             ADL's   min guard assist for LB selfcare sit to stand, min guard for toilet transfers and shower transfers, decreased RUE coordination and functional use but currently at a non'dominant level with selfcare tasks.  Min assist for dynamic  standing balance without use of an assistive device.  supervision level  selfcare re-training, LUE coordination and functional use, balance re-training, DME educaton, pt/family education   Mobility   S bed mobility and tranfers, S to mod assist for gait x 200' with RW, min assist 12 steps 1 rail  mod I for basic transfers, other goals downgraded to supervision car transfers, gait x 150' and up/down 12 steps  neuro re-ed,  balance, gait , safety awareness, pt and family ed   Communication             Safety/Cognition/ Behavioral Observations            Pain   patient reports no pain  pain less than or equalt o 4 on a scale of 0-10  asess pain q4h, medicate if indicated   Skin   skin without signs of injury/breakdown  maintain skin free of new injury/breakdown  assess skin q shift    Rehab Goals Patient on target to meet rehab goals: Yes Rehab Goals Revised: none *See Care Plan and progress notes for long and short-term goals.  Barriers to Discharge: Wife needi9ng more training, orthotic    Possible Resolutions to Barriers:  cont rehab, orthotist re eval    Discharge Planning/Teaching Needs:  Home with wife who can provide 24 hr light min assist if necessary-making good progress here  Wife has already had some family education, but therapists would like one more session each with her.  This will occur on  day of d/c prior to him leaving.   Team Discussion:  Pt's blood pressure and blood sugars are under good control.  Pt enjoys therapies, but doesn't do well when he's just sitting around.  Team feels he could leave a day sooner, if his wife will come back in for more training.  Pt continues to be a fall risk due to some loss of balance.  He is min guard with walker and will need continued PT.  Pt wants outpt PT.  Revisions to Treatment Plan:  Pt's d/c date moved earlier by a day.   Continued Need for Acute Rehabilitation Level of Care: The patient requires daily medical management by  a physician with specialized training in physical medicine and rehabilitation for the following conditions: Daily direction of a multidisciplinary physical rehabilitation program to ensure safe treatment while eliciting the highest outcome that is of practical value to the patient.: Yes Daily medical management of patient stability for increased activity during participation in an intensive rehabilitation regime.: Yes Daily analysis of laboratory values and/or radiology reports with any subsequent need for medication adjustment of medical intervention for : Neurological problems;Other  Serrita Lueth, [place] Deck 08/25/2014, 11:05 PM

## 2014-08-25 NOTE — Progress Notes (Signed)
Subjective: feels ready to go Discussed diabetic management Using less insulin with better control.  Most likely has stricter diet in hospital  ROS- neg CP, -SOB,, appetite good  Objective: Vital Signs: Blood pressure 154/72, pulse 70, temperature 97.7 F (36.5 C), temperature source Oral, resp. rate 17, height 5' 5" (1.651 m), weight 89.12 kg (196 lb 7.6 oz), SpO2 96 %. No results found. Results for orders placed or performed during the hospital encounter of 08/12/14 (from the past 72 hour(s))  Glucose, capillary     Status: Abnormal   Collection Time: 08/22/14 12:09 PM  Result Value Ref Range   Glucose-Capillary 134 (H) 65 - 99 mg/dL  Glucose, capillary     Status: Abnormal   Collection Time: 08/22/14  4:29 PM  Result Value Ref Range   Glucose-Capillary 130 (H) 65 - 99 mg/dL  Glucose, capillary     Status: Abnormal   Collection Time: 08/22/14  8:47 PM  Result Value Ref Range   Glucose-Capillary 181 (H) 65 - 99 mg/dL  Glucose, capillary     Status: Abnormal   Collection Time: 08/23/14  6:42 AM  Result Value Ref Range   Glucose-Capillary 156 (H) 65 - 99 mg/dL  Glucose, capillary     Status: None   Collection Time: 08/23/14 11:49 AM  Result Value Ref Range   Glucose-Capillary 77 65 - 99 mg/dL  Glucose, capillary     Status: Abnormal   Collection Time: 08/23/14  4:26 PM  Result Value Ref Range   Glucose-Capillary 172 (H) 65 - 99 mg/dL  Glucose, capillary     Status: Abnormal   Collection Time: 08/23/14  8:45 PM  Result Value Ref Range   Glucose-Capillary 164 (H) 65 - 99 mg/dL   Comment 1 Notify RN   Glucose, capillary     Status: Abnormal   Collection Time: 08/24/14  6:43 AM  Result Value Ref Range   Glucose-Capillary 139 (H) 65 - 99 mg/dL   Comment 1 Notify RN   Glucose, capillary     Status: Abnormal   Collection Time: 08/24/14 11:56 AM  Result Value Ref Range   Glucose-Capillary 204 (H) 65 - 99 mg/dL  Basic metabolic panel     Status: Abnormal   Collection  Time: 08/24/14  2:10 PM  Result Value Ref Range   Sodium 136 135 - 145 mmol/L   Potassium 4.2 3.5 - 5.1 mmol/L   Chloride 105 101 - 111 mmol/L   CO2 19 (L) 22 - 32 mmol/L   Glucose, Bld 143 (H) 65 - 99 mg/dL   BUN 38 (H) 6 - 20 mg/dL   Creatinine, Ser 2.15 (H) 0.61 - 1.24 mg/dL   Calcium 9.1 8.9 - 10.3 mg/dL   GFR calc non Af Amer 28 (L) >60 mL/min   GFR calc Af Amer 33 (L) >60 mL/min    Comment: (NOTE) The eGFR has been calculated using the CKD EPI equation. This calculation has not been validated in all clinical situations. eGFR's persistently <60 mL/min signify possible Chronic Kidney Disease.    Anion gap 12 5 - 15  Glucose, capillary     Status: Abnormal   Collection Time: 08/24/14  4:44 PM  Result Value Ref Range   Glucose-Capillary 204 (H) 65 - 99 mg/dL  Glucose, capillary     Status: Abnormal   Collection Time: 08/24/14  8:52 PM  Result Value Ref Range   Glucose-Capillary 243 (H) 65 - 99 mg/dL   Comment 1 Notify RN  Glucose, capillary     Status: Abnormal   Collection Time: 08/25/14  6:34 AM  Result Value Ref Range   Glucose-Capillary 131 (H) 65 - 99 mg/dL   Comment 1 Notify RN      HEENT: normal Cardio: RRR and no murmur Resp: CTA B/L and unlabored GI: BS positive and NT, ND Extremity:  Pulses positive and No Edema Skin:   Intact Neuro: Alert/Oriented, Normal Sensory, Abnormal Motor 4/5 on RIght side , 5/5 on Left and Abnormal FMC Ataxic/ dec FMC  Gen NAD   Assessment/Plan: 1. Functional deficits secondary to Left anterior choroidal artery thrombotic infarct right Hemiparesis  Stable for D/C today F/u PCP in 1-2 weeks F/u PM&R 3 weeks See D/C summary See D/C instructions FIM: Function - Bathing Position: Shower Body parts bathed by patient: Right arm, Left arm, Chest, Abdomen, Front perineal area, Buttocks, Right upper leg, Left upper leg, Right lower leg, Left lower leg Assist Level: Touching or steadying assistance(Pt > 75%)  Function- Upper Body  Dressing/Undressing What is the patient wearing?: Pull over shirt/dress Pull over shirt/dress - Perfomed by patient: Thread/unthread right sleeve, Thread/unthread left sleeve, Put head through opening, Pull shirt over trunk Function - Lower Body Dressing/Undressing Lower body dressing/undressing activity did not occur: 4: Steadying Assist What is the patient wearing?: Pants, Socks, Shoes, AFO Pants- Performed by patient: Thread/unthread right pants leg, Thread/unthread left pants leg Socks - Performed by patient: Don/doff left sock, Don/doff right sock Shoes - Performed by patient: Don/doff left shoe, Fasten left Shoes - Performed by helper: Don/doff right shoe, Fasten right AFO - Performed by helper: Don/doff right AFO  Function - Toileting Toileting steps completed by patient: Adjust clothing prior to toileting, Performs perineal hygiene, Adjust clothing after toileting Toileting Assistive Devices: Grab bar or rail for support  Function - Toilet Transfers Toilet transfer assistive device: Elevated toilet seat/BSC over toilet, Grab bar  Function - Chair/bed transfer Chair/bed transfer method: Ambulatory Chair/bed transfer assist level: Supervision or verbal cues Chair/bed transfer assistive device: Walker Chair/bed transfer details: Visual cues/gestures for sequencing, Verbal cues for precautions/safety  Function - Locomotion: Wheelchair Will patient use wheelchair at discharge?: No Type: Manual Max wheelchair distance: 150 Assist Level: Supervision or verbal cues Assist Level: Supervision or verbal cues Assist Level: Supervision or verbal cues Function - Locomotion: Ambulation Assistive device: Walker-rolling Max distance: 100 Assist level: Supervision or verbal cues Assist level: Supervision or verbal cues Assist level: Supervision or verbal cues Assist level: Moderate assist (Pt 50 - 74%)  Function - Comprehension Comprehension: Auditory Comprehension assist level:  Follows complex conversation/direction with no assist  Function - Expression Expression: Verbal Expression assist level: Expresses complex 90% of the time/cues < 10% of the time  Function - Social Interaction Social Interaction assist level: Interacts appropriately with others - No medications needed.  Function - Problem Solving Problem solving assist level: Solves complex 90% of the time/cues < 10% of the time  Function - Memory Memory assist level: Recognizes or recalls 90% of the time/requires cueing < 10% of the time  Medical Problem List and Plan: 1. Functional deficits secondary to left basal ganglia/corona radiata infarct 2. DVT Prophylaxis/Anticoagulation: Pharmaceutical: Lovenox--indicated due to immobility, Plavix for CVA 3. Pain Management: N/A.  4. Mood: Team to provide ego support. LCSW to follow for evaluation and support.  5. Neuropsych: This patient is capable of making decisions on his own behalf. 6. Skin/Wound Care: Routine pressure relief measures. Maintain adequate fluid and nutritional intake.  7. Fluids/Electrolytes/Nutrition:   Monitor I/O. 8. DM type 2: Poorly controlled due to dietary non-compliance. Generally better but elevated yest pm-f/u PCP            9. HTN: Monitor BP every 8 hours. Continue losartan (max dose), coreg (max dose)and hytrin (max dose) . Titrate medications , increase hytrin to 10mg. am systolics are now in a better range ~130s-140s, no dizziness   10.  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 

## 2014-08-25 NOTE — Progress Notes (Signed)
Occupational Therapy Discharge Summary  Patient Details  Name: George Stevens MRN: [id number] Date of Birth: 04/17/1940  Today's Date: 08/25/2014 OT Individual Time: 0930-1029 OT Individual Time Calculation (min): 59 min    Session Notes: Session 1:  Pt's wife provided hands on assist for bathing and dressing session.  He was able to ambulate to the shower with supervision using the RW for support and sit on the shower bench.  He performed all aspects of bathing without assistance from wife, even though she eagerly wanted to assist him.  Emphasized the need for him to perform all that he can as it is a functional form of exercise and will increase his progress and independence.  He was able to complete most of dressing sit to stand at the bedside chair.  Slight difficulty donning socks but he is aware of use of the sockaide if he chooses to purchase.  She was able to assist him with donning the foot up brace in the right shoe after therapist demonstration.  Proceeded to the ADL apartment once pt was finished with dressing.  Supervision for use of the RW to ambulate.  Practiced simulated shower transfers using walk-in shower grid with wife assisting.  He was able to complete with supervision.  Pt still needs mod instructional cueing to push up from the surface he is transferring from as he tends to want to hold onto the walker with one hand.  Returned to room with wife assisting to complete session  Session 2:  11:30-12:00   30 mins    Pt ambulated from the lower part of the gym to the UE ergonometer for RUE strengthening.  He was able to perform 4, 2 minute intervals with resistance placed on level 5 and RPMs averaging 16 per minute.  Had pt perform first interval peddling forward using both UEs.  After a brief rest progressed to just using the RUE.  He required 2-3 minute rest break in-between sets to progress to the next.  Third interval was performed with peddling in reverse and final set was  completed peddling forward.  Pt returned to room at end of session.  Handout provided with dowel exercises and functional reaching exercises.   Patient has met 12 of 12 long term goals due to improved activity tolerance, improved balance, postural control, ability to compensate for deficits, functional use of  RIGHT upper extremity, improved awareness and improved coordination.  Patient to discharge at overall Supervision level.  Patient's care partner is independent to provide the necessary physical assistance at discharge.    Reasons goals not met: NA  Recommendation:  Pt has been educated on RUE FM and gross motor coordination activities and will continue to work on his balance with outpatient physical therapy.  Do not feel he needs formal OT services post rehab stay as long as he continues to perform the exercises given.  His wife has been educated on the need for him to have supervision with all functional transfers and mobility.  They also have been educated on the need for a shower seat as well as grab bars in the shower.  His wife reports that everything will be installed by today.    Equipment: No equipment provided  Reasons for discharge: treatment goals met and discharge from hospital  Patient/family agrees with progress made and goals achieved: Yes  OT Discharge Precautions/Restrictions  Precautions Precautions: Fall Restrictions Weight Bearing Restrictions: No  Vital Signs Therapy Vitals Temp: 98.5 F (36.9 C) Temp Source: Oral Pulse  Rate: 63 Resp: 18 BP: 140/62 mmHg Patient Position (if appropriate): Sitting Oxygen Therapy SpO2: 99 % O2 Device: Not Delivered Pain  No report of pain ADL  See function scale for details  Vision/Perception  Vision- History Baseline Vision/History: Wears glasses Wears Glasses: Reading only Patient Visual Report: No change from baseline Vision- Assessment Vision Assessment?: No apparent visual deficits  Cognition Overall  Cognitive Status: Within Functional Limits for tasks assessed Arousal/Alertness: Awake/alert Orientation Level: Oriented X4 Attention: Alternating Selective Attention: Appears intact Selective Attention Impairment: Functional basic;Functional complex Alternating Attention: Appears intact Memory: Appears intact Awareness: Appears intact Problem Solving: Appears intact Safety/Judgment: Impaired Comments: Feel pt is a safety risk but likely not related to his new CVA.  Decreased understanding of the need for assistive device and supervision for mobility.  Sensation Sensation Light Touch: Appears Intact Stereognosis: Appears Intact Hot/Cold: Appears Intact Proprioception: Appears Intact Coordination Gross Motor Movements are Fluid and Coordinated: No Fine Motor Movements are Fluid and Coordinated: No Coordination and Movement Description: Pt still with slight limitations in RUE gross and FM coordination but uses functionally for selfcare tasks without difficulty.  Only slight ataxia noted with finger to nose testing when comparing the RUE with the LUE.   Heel Shin Test: = bil Motor  Motor Motor: Hemiplegia Mobility  Transfers Transfers: Sit to Stand;Stand to Sit Sit to Stand: 5: Supervision Sit to Stand Details: Verbal cues for precautions/safety Stand to Sit: 5: Supervision Stand to Sit Details (indicate cue type and reason): Tactile cues for weight shifting  Trunk/Postural Assessment  Cervical Assessment Cervical Assessment: Within Functional Limits Thoracic Assessment Thoracic Assessment: Within Functional Limits Lumbar Assessment Lumbar Assessment: Within Functional Limits Postural Control Righting Reactions: delayed with LOB to R, pt still with knee flexion on the right without any awareness that it is occuring.  Balance Balance Balance Assessed: Yes Standardized Balance Assessment Standardized Balance Assessment: Berg Balance Test Berg Balance Test Sit to Stand: Able  to stand without using hands and stabilize independently Standing Unsupported: Able to stand safely 2 minutes Sitting with Back Unsupported but Feet Supported on Floor or Stool: Able to sit safely and securely 2 minutes Stand to Sit: Sits safely with minimal use of hands Transfers: Able to transfer safely, minor use of hands Standing Unsupported with Eyes Closed: Able to stand 10 seconds safely Standing Ubsupported with Feet Together: Able to place feet together independently and stand for 1 minute with supervision From Standing, Reach Forward with Outstretched Arm: Can reach confidently >25 cm (10") From Standing Position, Pick up Object from Floor: Able to pick up shoe, needs supervision From Standing Position, Turn to Look Behind Over each Shoulder: Looks behind from both sides and weight shifts well Turn 360 Degrees: Able to turn 360 degrees safely but slowly Standing Unsupported, Alternately Place Feet on Step/Stool: Able to complete 4 steps without aid or supervision Standing Unsupported, One Foot in Front: Able to take small step independently and hold 30 seconds Standing on One Leg: Tries to lift leg/unable to hold 3 seconds but remains standing independently Total Score: 45 Timed Up and Go Test TUG: Normal TUG Normal TUG (seconds): 22.41 Static Sitting Balance Static Sitting - Balance Support: Feet supported Static Sitting - Level of Assistance: 7: Independent Dynamic Sitting Balance Dynamic Sitting - Balance Support: Feet supported;Feet unsupported Dynamic Sitting - Level of Assistance: 6: Modified independent (Device/Increase time) Static Standing Balance Static Standing - Balance Support: No upper extremity supported Static Standing - Level of Assistance: 5: Stand by assistance  Dynamic Standing Balance Dynamic Standing - Balance Support: During functional activity Dynamic Standing - Level of Assistance: 5: Stand by assistance Dynamic Standing - Balance Activities: Reaching  across midline;Compliant surfaces Dynamic Standing - Comments: poor awareness of RLE placement Extremity/Trunk Assessment RUE Assessment RUE Assessment: Exceptions to [facility] (Pt with isolated RUE movement in all joints.  Slight decreased ful AROM shoulder flexion.  Currently 0-125 degrees presently.  Pt with decreased ability to maintain full elbow extension during shoulder flexion as well.   ) LUE Assessment LUE Assessment: Within Functional Limits  Function:   Eating Eating   Eating Assist Level: More than reasonable amount of time           Grooming Oral Care,Brush Teeth, Clean Dentures Activity:      Assist Level: More than reasonable amount of time      Wash, Rinse, Dry Face Activity   Assist Level: More than reasonable time      Wash, Rinse, Dry Hands Activity   Assist Level: More than reasonable time      Brush, Comb Hair Activity   Assist Level: More than reasonable time    Shave Activity   Assist Level: More than reasonable time      Apply Makeup Activity Apply makeup activity did not occur: N/A                                                           Bathing Bathing position   Position: Shower  Bathing parts Body parts bathed by patient: Right arm;Left arm;Chest;Abdomen;Front perineal area;Buttocks;Right upper leg;Left upper leg;Right lower leg;Left lower leg    Bathing assist Assist Level: Supervision or verbal cues       Upper Body Dressing/Undressing Upper body dressing   What is the patient wearing?: Pull over shirt/dress     Pull over shirt/dress - Perfomed by patient: Thread/unthread right sleeve;Thread/unthread left sleeve;Put head through opening;Pull shirt over trunk          Upper body assist         Lower Body Dressing/Undressing Lower body dressing   What is the patient wearing?: Pants;Socks;Shoes;AFO     Pants- Performed by patient: Thread/unthread right pants leg;Thread/unthread left pants leg;Pull pants up/down        Socks - Performed by patient: Don/doff right sock;Don/doff left sock   Shoes - Performed by patient: Don/doff left shoe;Fasten left Shoes - Performed by helper: Don/doff right shoe;Fasten right   AFO - Performed by helper: Don/doff right AFO      Lower body assist         Toileting Toileting   Toileting steps completed by patient: Performs perineal hygiene;Adjust clothing prior to toileting;Adjust clothing after toileting      Toileting assist Assist level: Supervision or verbal cues    Bed Mobility Roll left and right activity      Sit to lying activity      Lying to sitting activity      Mobility details     Transfers Sit to stand transfer        Chair/bed transfer              Toilet transfer   Toilet transfer assistive device: Elevated toilet seat/BSC over toilet       Assist level to toilet: Supervision or  verbal cues Assist level from toilet: Supervision or verbal cues  Tub/shower transfer   Tub/shower assistive device: Tub transfer bench;Grab bars;Walk in shower   Assist level into tub: Supervision or verbal cues Assist level out of tub: Supervision or verbal cues   Cognition Comprehension Comprehension assist level: Follows complex conversation/direction with no assist  Expression Expression assist level: Expresses complex 90% of the time/cues < 10% of the time  Social Interaction Social Interaction assist level: Interacts appropriately with others - No medications needed.  Problem Solving Problem solving assist level: Solves basic 25 - 49% of the time - needs direction more than half the time to initiate, plan or complete simple activities  Memory Memory assist level: Recognizes or recalls 50 - 74% of the time/requires cueing 25 - 49% of the time    Minda Faas OTR/L 08/25/2014, 5:14 PM

## 2014-08-25 NOTE — Progress Notes (Signed)
Social Work Patient ID: George Stevens, male   DOB: 1940-07-03, 74 y.o.   MRN: [id number]   Pt and team decided that pt would be okay to d/c after therapies/training with wife today, rather than waiting until tomorrow morning.  MD was okay with this, as well.  CSW will arrange outpt PT and order DME and schedule PCP post hospital f/u.

## 2014-08-30 ENCOUNTER — Ambulatory Visit: Payer: Medicare HMO | Attending: Physical Medicine & Rehabilitation | Admitting: Physical Therapy

## 2014-08-30 DIAGNOSIS — M25511 Pain in right shoulder: Secondary | ICD-10-CM | POA: Diagnosis present

## 2014-08-30 DIAGNOSIS — R269 Unspecified abnormalities of gait and mobility: Secondary | ICD-10-CM | POA: Insufficient documentation

## 2014-08-30 DIAGNOSIS — R29898 Other symptoms and signs involving the musculoskeletal system: Secondary | ICD-10-CM

## 2014-08-30 DIAGNOSIS — R262 Difficulty in walking, not elsewhere classified: Secondary | ICD-10-CM | POA: Insufficient documentation

## 2014-08-30 NOTE — Therapy (Signed)
[facility] [address]  [address] [place], [place], [address] Phone: [phone]   Fax:  [phone]  Physical Therapy Evaluation  Patient Details  Name: George Stevens MRN: [id number] Date of Birth: Dec 25, 1940 Referring Provider:  Erick Colace, MD  Encounter Date: 08/30/2014      PT End of Session - 08/30/14 1028    Visit Number 1   Number of Visits 12   Date for PT Re-Evaluation 10/11/14   PT Start Time 1021   PT Stop Time 1103   PT Time Calculation (min) 42 min      Past Medical History  Diagnosis Date  . Hypertension   . Diabetes mellitus without complication   . History of colon polyps   . Hesitancy of micturition   . Concussion with brief LOC     Syncope X 1 in 1996. Another episode with fall and loss of sense of taste as well as brief memory deficits.     Past Surgical History  Procedure Laterality Date  . Cataract extraction    . Colonoscopy w/ polypectomy      There were no vitals filed for this visit.  Visit Diagnosis:  Abnormality of gait - Plan: PT plan of care cert/re-cert  Right leg weakness - Plan: PT plan of care cert/re-cert  Difficulty walking - Plan: PT plan of care cert/re-cert  Right arm weakness - Plan: PT plan of care cert/re-cert  Pain in joint, shoulder region, right - Plan: PT plan of care cert/re-cert      Subjective Assessment - 08/30/14 1026    Subjective pt sent to OPPT due to CVA on 08/09/14 with resulting R sided weakness.  Following CVA pt in hospital 08/09/14 - 8/181/6.  States received therapy while in hospital.  Currently able to ambulate with use of RW for community distances (able to go grocery shopping), states does not typically use RW while at home, is not driving.  Able to perform all ADLs, transfers, and self care indepently per pt report.   Currently in Pain? Yes   Pain Score 5    Pain Location Shoulder   Pain Orientation Right            [facility]  PT Assessment - 08/30/14 0001    Assessment   Medical Diagnosis s/p CVA with R sided weakness   Onset Date/Surgical Date 08/09/14   Hand Dominance Right   Precautions   Precautions Fall   Required Braces or Orthoses Other Brace/Splint   Other Brace/Splint R AFO   Balance Screen   Has the patient fallen in the past 6 months No   Has the patient had a decrease in activity level because of a fear of falling?  No   Is the patient reluctant to leave their home because of a fear of falling?  No   Home Environment   Living Environment Private residence   Living Arrangements Spouse/significant other;Children   Type of Home House   Home Layout Two level   Prior Function   Vocation Retired   Leisure enjoys working in the yard but unable since CVA   Observation/Other Assessments   Focus on Therapeutic Outcomes (FOTO)  37% limitation   ROM / Strength   AROM / PROM / Strength Strength;AROM   AROM   Overall AROM Comments R Shoulder with mild restrictions into Flex and ABD   Strength   Strength Assessment Site Shoulder   Right/Left Shoulder Right   Right  Shoulder Flexion 4+/5   Right Shoulder Extension 4+/5   Right Shoulder ABduction 4/5   Right Shoulder Internal Rotation 4/5   Right Shoulder External Rotation 4/5   Right/Left Hip Right   Right Hip Flexion 4/5   Right Hip Extension 4+/5   Right Hip External Rotation  --  4/5   Right Hip Internal Rotation  --  4/5   Right Hip ABduction 4/5   Right Hip ADduction 4+/5   Right/Left Knee Right   Right Knee Flexion 4+/5   Right Knee Extension 4+/5   Right/Left Ankle Right   Right Ankle Dorsiflexion 4-/5   Right Ankle Plantar Flexion 3/5   Right Ankle Inversion 4/5   Right Ankle Eversion 4-/5   Balance   Balance Assessed Yes   Standardized Balance Assessment   Standardized Balance Assessment Berg Balance Test   Berg Balance Test   Sit to Stand Able to stand without using hands and stabilize independently   Standing Unsupported  Able to stand safely 2 minutes   Sitting with Back Unsupported but Feet Supported on Floor or Stool Able to sit safely and securely 2 minutes   Stand to Sit Sits safely with minimal use of hands   Transfers Able to transfer safely, minor use of hands   Standing Unsupported with Eyes Closed Able to stand 10 seconds safely   Standing Ubsupported with Feet Together Able to place feet together independently and stand for 1 minute with supervision   From Standing, Reach Forward with Outstretched Arm Can reach confidently >25 cm (10")   From Standing Position, Pick up Object from Floor Able to pick up shoe safely and easily   From Standing Position, Turn to Look Behind Over each Shoulder Looks behind from both sides and weight shifts well   Turn 360 Degrees Able to turn 360 degrees safely but slowly   Standing Unsupported, Alternately Place Feet on Step/Stool Able to complete 4 steps without aid or supervision   Standing Unsupported, One Foot in Front Able to plae foot ahead of the other independently and hold 30 seconds   Standing on One Leg Tries to lift leg/unable to hold 3 seconds but remains standing independently   Total Score 47         TODAY'S TREATMENT TherEx - Bridge (focus on slow mechanics and equal r/l control); standing R ankle DF stretch          PT Education - 08/30/14 1247    Education provided Yes   Education Details advised pt to perform HEP from hospital PT and to include standing R ankle DF stretch.   Person(s) Educated Patient   Methods Explanation;Demonstration   Comprehension Verbalized understanding;Returned demonstration             PT Long Term Goals - 08/30/14 1257    PT LONG TERM GOAL #1   Title pt able to ambulate without need for AD or AFO with normal mechanics and distances ad lib by 10/11/14   Status New   PT LONG TERM GOAL #2   Title pt displays R ankle MMT 4+/5 or better all planes other than PF which should be 4/5 or better by 10/11/14.    Status New   PT LONG TERM GOAL #3   Title pt scores 50/56 or better with Berg by 10/11/14   Status New   PT LONG TERM GOAL #4   Title pt able to use R UE without limitation by pain, LOM, or weakness by 10/11/14  Status New               Plan - Sep 19, 2014 1250    Clinical Impression Statement pt is nearly 2 weeks s/p CVA with R sided-weakness. He has progressed rather well is appears.  His UE and LE MMT is 4/5 most planes (other than ankle DF 4-/5 and PF 3/5) and his Berg balance score was 47/56 today.  Pt is ambulating with use of RW and AFO but I'm hoping to get rid of AFO ASAP.  Chief limitation at ankle doesn't seem to be weakness causing toe drag, but stiffness into ankle DF.  Added ankle stretching to HEP.  Pt with c/o R shoulder pain today which is likely related to weakness and decreased stability causing some subacromial impingement.  We will focus on shoulder stabiliy, LE strengthening, as well as gait and balance training.   Pt will benefit from skilled therapeutic intervention in order to improve on the following deficits Decreased strength;Decreased mobility;Improper body mechanics;Impaired flexibility;Decreased balance;Pain;Decreased range of motion;Difficulty walking;Abnormal gait   Rehab Potential Good   PT Frequency 2x / week   PT Duration 6 weeks   PT Treatment/Interventions Balance training;Therapeutic exercise;Therapeutic activities;Taping;Vasopneumatic Device;Manual techniques;Neuromuscular re-education;Gait training;Stair training;Electrical Stimulation;Functional mobility training;Patient/family education   PT Next Visit Plan ankle DF stretch, shoulder stability, LE strengthening and hip stability, gait and balance training, manual and/or modalities prn for shoulder pain   Consulted and Agree with Plan of Care Patient          G-Codes - 09/19/2014 1227    Functional Assessment Tool Used foto 37% limitation   Functional Limitation Mobility: Walking and moving around    Mobility: Walking and Moving Around Current Status [phone]) At least 20 percent but less than 40 percent impaired, limited or restricted   Mobility: Walking and Moving Around Goal Status [phone]) At least 1 percent but less than 20 percent impaired, limited or restricted       Problem List Patient Active Problem List   Diagnosis Date Noted  . CKD (chronic kidney disease) 08/25/2014  . Right hemiparesis 08/12/2014  . 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 Diabetes type 2, uncontrolled   . Cellulitis of right upper extremity   . Essential hypertension   . Primary gout   . Basal ganglia infarction 08/09/2014    [facility] PT, OCS 09-19-2014, 1:03 PM  [facility] [address]  [address] [place], [place], [address] Phone: [phone]   Fax:  [phone]

## 2014-09-05 ENCOUNTER — Ambulatory Visit: Payer: Medicare HMO | Admitting: Physical Therapy

## 2014-09-05 DIAGNOSIS — R29898 Other symptoms and signs involving the musculoskeletal system: Secondary | ICD-10-CM

## 2014-09-05 DIAGNOSIS — R269 Unspecified abnormalities of gait and mobility: Secondary | ICD-10-CM | POA: Diagnosis not present

## 2014-09-05 DIAGNOSIS — R262 Difficulty in walking, not elsewhere classified: Secondary | ICD-10-CM

## 2014-09-05 DIAGNOSIS — M25511 Pain in right shoulder: Secondary | ICD-10-CM

## 2014-09-05 NOTE — Therapy (Signed)
[facility] [address]  [address] [place], [place], [address] Phone: [phone]   Fax:  [phone]  Physical Therapy Treatment  Patient Details  Name: Acea Yagi MRN: [id number] Date of Birth: [birth year] Referring Provider:  Erick Colace, MD  Encounter Date: 09/05/2014      PT End of Session - 09/05/14 1024    Visit Number 2   Date for PT Re-Evaluation 10/11/14   PT Start Time 1023   PT Stop Time 1114   PT Time Calculation (min) 51 min      Past Medical History  Diagnosis Date  . Hypertension   . Diabetes mellitus without complication   . History of colon polyps   . Hesitancy of micturition   . Concussion with brief LOC     Syncope X 1 in 1996. Another episode with fall and loss of sense of taste as well as brief memory deficits.     Past Surgical History  Procedure Laterality Date  . Cataract extraction    . Colonoscopy w/ polypectomy      There were no vitals filed for this visit.  Visit Diagnosis:  Abnormality of gait  Right leg weakness  Difficulty walking  Right arm weakness  Pain in joint, shoulder region, right      Subjective Assessment - 09/05/14 1024    Subjective states feels tired this AM and is amazed how quickly he fatigues since CVA.   Currently in Pain? Yes   Pain Score 3    Pain Location Shoulder   Pain Orientation Right        TODAY'S TREATMENT TherEx - NuStep lvl 4, 3' Rocker Ankle DF Stretch 3x20"  Neuro - L foot on 6" step while R LE performs Hip flexion and knee flexion and ankle DF with Yellow TB to R forefoot 2x10 with single pole A and CGA Standing on Foam Pad Toe Tapping to 8" step10x each with single pole A and CGA TRX DL Squat 8x (tendency for R femur IR) TRX DL Squat with Blue TB at knees 8x (better mechanics) Seated R Ankle DF, EV, IV Green TB 10x each (added to HEP) Seated B Ankle DF/PF 10x (added to HEP)  Kinesiotape - "I": strip along R  anterior tibialis  Gait Training - 10 cone stepover with cones set for 24" stride - 4 laps, 1st with RW then 3 without RW but all with CGA                                 PT Long Term Goals - 09/05/14 1122    PT LONG TERM GOAL #1   Title pt able to ambulate without need for AD or AFO with normal mechanics and distances ad lib by 10/11/14   Status On-going   PT LONG TERM GOAL #2   Title pt displays R ankle MMT 4+/5 or better all planes other than PF which should be 4/5 or better by 10/11/14.   Status On-going   PT LONG TERM GOAL #3   Title pt scores 50/56 or better with Berg by 10/11/14   Status On-going   PT LONG TERM GOAL #4   Title pt able to use R UE without limitation by pain, LOM, or weakness by 10/11/14   Status On-going               Plan - 09/05/14 1120  Clinical Impression Statement performed very well with today's treatment focusing on gait and LE strength/control.  Trial of kinesiotape to R lower leg to facilitate ankle DF.   PT Next Visit Plan ankle DF stretch, shoulder stability, LE strengthening and hip stability, gait and balance training, manual and/or modalities prn for shoulder pain   Consulted and Agree with Plan of Care Patient        Problem List Patient Active Problem List   Diagnosis Date Noted  . CKD (chronic kidney disease) 08/25/2014  . Right hemiparesis 08/12/2014  . 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 Diabetes type 2, uncontrolled   . Cellulitis of right upper extremity   . Essential hypertension   . Primary gout   . Basal ganglia infarction 08/09/2014    Tawan Degroote PT, OCS 09/05/2014, 11:22 AM  [facility] [address]  [address] [place], [place], [address] Phone: [phone]   Fax:  [phone]

## 2014-09-14 ENCOUNTER — Ambulatory Visit: Payer: Medicare HMO | Attending: Physical Medicine & Rehabilitation | Admitting: Physical Therapy

## 2014-09-14 DIAGNOSIS — R262 Difficulty in walking, not elsewhere classified: Secondary | ICD-10-CM | POA: Insufficient documentation

## 2014-09-14 DIAGNOSIS — R269 Unspecified abnormalities of gait and mobility: Secondary | ICD-10-CM | POA: Insufficient documentation

## 2014-09-14 DIAGNOSIS — R29898 Other symptoms and signs involving the musculoskeletal system: Secondary | ICD-10-CM | POA: Diagnosis present

## 2014-09-14 NOTE — Therapy (Addendum)
[facility] [address]  [place], [place], [address] Phone: [phone]   Fax:  [phone]  Physical Therapy Treatment  Patient Details  Name: Oral Remache MRN: [id number] Date of Birth: 05/31/1940 Referring Provider:  Charlett Blake, MD  Encounter Date: 09/14/2014      PT End of Session - 09/14/14 1059    Visit Number 3   Number of Visits 12   Date for PT Re-Evaluation 10/11/14   PT Start Time 7681   PT Stop Time 1059   PT Time Calculation (min) 36 min      Past Medical History  Diagnosis Date  . Hypertension   . Diabetes mellitus without complication   . History of colon polyps   . Hesitancy of micturition   . Concussion with brief LOC     Syncope X 1 in 1996. Another episode with fall and loss of sense of taste as well as brief memory deficits.     Past Surgical History  Procedure Laterality Date  . Cataract extraction    . Colonoscopy w/ polypectomy      There were no vitals filed for this visit.  Visit Diagnosis:  Abnormality of gait  Right leg weakness  Difficulty walking      Subjective Assessment - 09/14/14 1028    Subjective Pt not wearing AFO today but states has been wearing otherwise.  Is not using RW around the house but uses for community ambulation due to concern related to fatigue.  States has been performing HEP but has difficulty performing DF with Tband.   Currently in Pain? No/denies           TODAY'S TREATMENT TherEx - TRX Squat with Heel Raise at top 10x Wall Lean Ankle DF Stretch 3x20" TRX assisted 9" step-up with R 10x B Heel/Toe Raise at counter 15x  Gait Training - 10 cone stepover with cones set for 24" stride initially then to 36" - all with CGA, performed approx 12 laps 6" Foam Roll stepovers with focus on continuous FW movement - 2 rolls approx 10 laps FW then same thing side-stepping 12x each  Neuro - Standing on Foam Pad Toe Tapping to 8"  step10x each with CGA                      PT Education - 09/14/14 1221    Education provided Yes   Education Details HEP = ankle tband exercises; standing heel/toe raises   Person(s) Educated Patient   Methods Explanation   Comprehension Verbalized understanding;Returned demonstration             PT Long Term Goals - 09/05/14 1122    PT LONG TERM GOAL #1   Title pt able to ambulate without need for AD or AFO with normal mechanics and distances ad lib by 10/11/14   Status On-going   PT LONG TERM GOAL #2   Title pt displays R ankle MMT 4+/5 or better all planes other than PF which should be 4/5 or better by 10/11/14.   Status On-going   PT LONG TERM GOAL #3   Title pt scores 50/56 or better with Berg by 10/11/14   Status On-going   PT LONG TERM GOAL #4   Title pt able to use R UE without limitation by pain, LOM, or weakness by 10/11/14   Status On-going        G-code mobility, walking around current and discharge  status CJ (20-40% limitation); goal was CI (1-20% limitation)       Plan - 09/14/14 1105    Clinical Impression Statement pt seems to be improving with gait safety in that only required CGA with gait training and able to clear objects around 80-90% of time (cones and 6" foam roller).  Tends to lead with R LE during ambulation then follows with body - some improvement with VS and TC today.   PT Next Visit Plan ankle DF stretch, shoulder stability, LE strengthening and hip stability, gait and balance training, manual and/or modalities prn for shoulder pain   Consulted and Agree with Plan of Care Patient        Problem List Patient Active Problem List   Diagnosis Date Noted  . CKD (chronic kidney disease) 08/25/2014  . Right hemiparesis 08/12/2014  . 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 Diabetes type 2, uncontrolled   . Cellulitis of right upper extremity   . Essential hypertension   . Primary gout   . Basal ganglia infarction 08/09/2014     Gedalya Jim PT, OCS 09/14/2014, 12:22 PM  [facility] [address]  [place], [place], [address] Phone: [phone]   Fax:  [phone]   PHYSICAL THERAPY DISCHARGE SUMMARY  Visits from Start of Care: 3 of 12 appointments  Current functional level related to goals / functional outcomes: No goals met, improved gait   Remaining deficits: Impaired balance, impaired gait mechanics, LE weakness   Education / Equipment: HEP Plan: Patient agrees to discharge.  Patient goals were not met. Patient is being discharged due to the patient's request.  ?????        Mr. Petit called yesterday stating he no longer wants to participate in PT.  He is therefore being discharged from our care at this time.  Leonette Most PT, OCS 09/21/2014 2:07 PM

## 2014-09-21 ENCOUNTER — Ambulatory Visit: Payer: Medicare HMO | Admitting: Physical Therapy

## 2014-09-23 ENCOUNTER — Encounter: Payer: Medicare HMO | Attending: Physical Medicine & Rehabilitation

## 2014-09-23 ENCOUNTER — Ambulatory Visit (HOSPITAL_BASED_OUTPATIENT_CLINIC_OR_DEPARTMENT_OTHER): Payer: Medicare HMO | Admitting: Physical Medicine & Rehabilitation

## 2014-09-23 ENCOUNTER — Encounter: Payer: Self-pay | Admitting: Physical Medicine & Rehabilitation

## 2014-09-23 DIAGNOSIS — G8190 Hemiplegia, unspecified affecting unspecified side: Secondary | ICD-10-CM

## 2014-09-23 DIAGNOSIS — R269 Unspecified abnormalities of gait and mobility: Secondary | ICD-10-CM | POA: Insufficient documentation

## 2014-09-23 DIAGNOSIS — G8191 Hemiplegia, unspecified affecting right dominant side: Secondary | ICD-10-CM

## 2014-09-23 DIAGNOSIS — M7541 Impingement syndrome of right shoulder: Secondary | ICD-10-CM | POA: Diagnosis not present

## 2014-09-23 DIAGNOSIS — I69398 Other sequelae of cerebral infarction: Secondary | ICD-10-CM

## 2014-09-23 DIAGNOSIS — I639 Cerebral infarction, unspecified: Secondary | ICD-10-CM | POA: Diagnosis not present

## 2014-09-23 DIAGNOSIS — F17290 Nicotine dependence, other tobacco product, uncomplicated: Secondary | ICD-10-CM | POA: Insufficient documentation

## 2014-09-23 DIAGNOSIS — I6381 Other cerebral infarction due to occlusion or stenosis of small artery: Secondary | ICD-10-CM

## 2014-09-23 NOTE — Patient Instructions (Addendum)
Graduated return to driving instructions were provided. It is recommended that the patient first drives with another licensed driver in an empty parking lot. If the patient does well with this, and they can drive on a quiet street with the licensed driver. If the patient does well with this they can drive on a busy street with a licensed driver. If the patient does well with this, the next time out they can go by himself. For the first month after resuming driving, I recommend no nighttime or Interstate driving.   Impingement Syndrome, Rotator Cuff, Bursitis with Rehab Impingement syndrome is a condition that involves inflammation of the tendons of the rotator cuff and the subacromial bursa, that causes pain in the shoulder. The rotator cuff consists of four tendons and muscles that control much of the shoulder and upper arm function. The subacromial bursa is a fluid filled sac that helps reduce friction between the rotator cuff and one of the bones of the shoulder (acromion). Impingement syndrome is usually an overuse injury that causes swelling of the bursa (bursitis), swelling of the tendon (tendonitis), and/or a tear of the tendon (strain). Strains are classified into three categories. Grade 1 strains cause pain, but the tendon is not lengthened. Grade 2 strains include a lengthened ligament, due to the ligament being stretched or partially ruptured. With grade 2 strains there is still function, although the function may be decreased. Grade 3 strains include a complete tear of the tendon or muscle, and function is usually impaired. SYMPTOMS   Pain around the shoulder, often at the outer portion of the upper arm.  Pain that gets worse with shoulder function, especially when reaching overhead or lifting.  Sometimes, aching when not using the arm.  Pain that wakes you up at night.  Sometimes, tenderness, swelling, warmth, or redness over the affected area.  Loss of strength.  Limited motion of  the shoulder, especially reaching behind the back (to the back pocket or to unhook bra) or across your body.  Crackling sound (crepitation) when moving the arm.  Biceps tendon pain and inflammation (in the front of the shoulder). Worse when bending the elbow or lifting. CAUSES  Impingement syndrome is often an overuse injury, in which chronic (repetitive) motions cause the tendons or bursa to become inflamed. A strain occurs when a force is paced on the tendon or muscle that is greater than it can withstand. Common mechanisms of injury include: Stress from sudden increase in duration, frequency, or intensity of training.  Direct hit (trauma) to the shoulder.  Aging, erosion of the tendon with normal use.  Bony bump on shoulder (acromial spur). RISK INCREASES WITH:  Contact sports (football, wrestling, boxing).  Throwing sports (baseball, tennis, volleyball).  Weightlifting and bodybuilding.  Heavy labor.  Previous injury to the rotator cuff, including impingement.  Poor shoulder strength and flexibility.  Failure to warm up properly before activity.  Inadequate protective equipment.  Old age.  Bony bump on shoulder (acromial spur). PREVENTION   Warm up and stretch properly before activity.  Allow for adequate recovery between workouts.  Maintain physical fitness:  Strength, flexibility, and endurance.  Cardiovascular fitness.  Learn and use proper exercise technique. PROGNOSIS  If treated properly, impingement syndrome usually goes away within 6 weeks. Sometimes surgery is required.  RELATED COMPLICATIONS   Longer healing time if not properly treated, or if not given enough time to heal.  Recurring symptoms, that result in a chronic condition.  Shoulder stiffness, frozen shoulder, or loss  of motion.  Rotator cuff tendon tear.  Recurring symptoms, especially if activity is resumed too soon, with overuse, with a direct blow, or when using poor  technique. TREATMENT  Treatment first involves the use of ice and medicine, to reduce pain and inflammation. The use of strengthening and stretching exercises may help reduce pain with activity. These exercises may be performed at home or with a therapist. If non-surgical treatment is unsuccessful after more than 6 months, surgery may be advised. After surgery and rehabilitation, activity is usually possible in 3 months.  MEDICATION  If pain medicine is needed, nonsteroidal anti-inflammatory medicines (aspirin and ibuprofen), or other minor pain relievers (acetaminophen), are often advised.  Do not take pain medicine for 7 days before surgery.  Prescription pain relievers may be given, if your caregiver thinks they are needed. Use only as directed and only as much as you need.  Corticosteroid injections may be given by your caregiver. These injections should be reserved for the most serious cases, because they may only be given a certain number of times. HEAT AND COLD  Cold treatment (icing) should be applied for 10 to 15 minutes every 2 to 3 hours for inflammation and pain, and immediately after activity that aggravates your symptoms. Use ice packs or an ice massage.  Heat treatment may be used before performing stretching and strengthening activities prescribed by your caregiver, physical therapist, or athletic trainer. Use a heat pack or a warm water soak. SEEK MEDICAL CARE IF:   Symptoms get worse or do not improve in 4 to 6 weeks, despite treatment.  New, unexplained symptoms develop. (Drugs used in treatment may produce side effects.) EXERCISES  RANGE OF MOTION (ROM) AND STRETCHING EXERCISES - Impingement Syndrome (Rotator Cuff  Tendinitis, Bursitis) These exercises may help you when beginning to rehabilitate your injury. Your symptoms may go away with or without further involvement from your physician, physical therapist or athletic trainer. While completing these exercises, remember:    Restoring tissue flexibility helps normal motion to return to the joints. This allows healthier, less painful movement and activity.  An effective stretch should be held for at least 30 seconds.  A stretch should never be painful. You should only feel a gentle lengthening or release in the stretched tissue. STRETCH - Flexion, Standing  Stand with good posture. With an underhand grip on your right / left hand, and an overhand grip on the opposite hand, grasp a broomstick or cane so that your hands are a little more than shoulder width apart.  Keeping your right / left elbow straight and shoulder muscles relaxed, push the stick with your opposite hand, to raise your right / left arm in front of your body and then overhead. Raise your arm until you feel a stretch in your right / left shoulder, but before you have increased shoulder pain.  Try to avoid shrugging your right / left shoulder as your arm rises, by keeping your shoulder blade tucked down and toward your mid-back spine. Hold for ____10______ seconds.  Slowly return to the starting position. Repeat _____10_____ times. Complete this exercise __________ times per day. STRETCH - Abduction, Supine  Lie on your back. With an underhand grip on your right / left hand and an overhand grip on the opposite hand, grasp a broomstick or cane so that your hands are a little more than shoulder width apart.  Keeping your right / left elbow straight and your shoulder muscles relaxed, push the stick with your opposite hand,  to raise your right / left arm out to the side of your body and then overhead. Raise your arm until you feel a stretch in your right / left shoulder, but before you have increased shoulder pain.  Try to avoid shrugging your right / left shoulder as your arm rises, by keeping your shoulder blade tucked down and toward your mid-back spine. Hold for __________ seconds.  Slowly return to the starting position. Repeat __________ times.  Complete this exercise __________ times per day. ROM - Flexion, Active-Assisted  Lie on your back. You may bend your knees for comfort.  Grasp a broomstick or cane so your hands are about shoulder width apart. Your right / left hand should grip the end of the stick, so that your hand is positioned "thumbs-up," as if you were about to shake hands.  Using your healthy arm to lead, raise your right / left arm overhead, until you feel a gentle stretch in your shoulder. Hold for __________ seconds.  Use the stick to assist in returning your right / left arm to its starting position. Repeat __________ times. Complete this exercise __________ times per day.  ROM - Internal Rotation, Supine   Lie on your back on a firm surface. Place your right / left elbow about 60 degrees away from your side. Elevate your elbow with a folded towel, so that the elbow and shoulder are the same height.  Using a broomstick or cane and your strong arm, pull your right / left hand toward your body until you feel a gentle stretch, but no increase in your shoulder pain. Keep your shoulder and elbow in place throughout the exercise.  Hold for __________ seconds. Slowly return to the starting position. Repeat __________ times. Complete this exercise __________ times per day. STRETCH - Internal Rotation  Place your right / left hand behind your back, palm up.  Throw a towel or belt over your opposite shoulder. Grasp the towel with your right / left hand.  While keeping an upright posture, gently pull up on the towel, until you feel a stretch in the front of your right / left shoulder.  Avoid shrugging your right / left shoulder as your arm rises, by keeping your shoulder blade tucked down and toward your mid-back spine.  Hold for __________ seconds. Release the stretch, by lowering your healthy hand. Repeat __________ times. Complete this exercise __________ times per day. ROM - Internal Rotation   Using an underhand  grip, grasp a stick behind your back with both hands.  While standing upright with good posture, slide the stick up your back until you feel a mild stretch in the front of your shoulder.  Hold for __________ seconds. Slowly return to your starting position. Repeat __________ times. Complete this exercise __________ times per day.  STRETCH - Posterior Shoulder Capsule   Stand or sit with good posture. Grasp your right / left elbow and draw it across your chest, keeping it at the same height as your shoulder.  Pull your elbow, so your upper arm comes in closer to your chest. Pull until you feel a gentle stretch in the back of your shoulder.  Hold for __________ seconds. Repeat __________ times. Complete this exercise __________ times per day. STRENGTHENING EXERCISES - Impingement Syndrome (Rotator Cuff Tendinitis, Bursitis) These exercises may help you when beginning to rehabilitate your injury. They may resolve your symptoms with or without further involvement from your physician, physical therapist or athletic trainer. While completing these exercises, remember:  Muscles can gain both the endurance and the strength needed for everyday activities through controlled exercises.  Complete these exercises as instructed by your physician, physical therapist or athletic trainer. Increase the resistance and repetitions only as guided.  You may experience muscle soreness or fatigue, but the pain or discomfort you are trying to eliminate should never worsen during these exercises. If this pain does get worse, stop and make sure you are following the directions exactly. If the pain is still present after adjustments, discontinue the exercise until you can discuss the trouble with your clinician.  During your recovery, avoid activity or exercises which involve actions that place your injured hand or elbow above your head or behind your back or head. These positions stress the tissues which you are trying  to heal. STRENGTH - Scapular Depression and Adduction   With good posture, sit on a firm chair. Support your arms in front of you, with pillows, arm rests, or on a table top. Have your elbows in line with the sides of your body.  Gently draw your shoulder blades down and toward your mid-back spine. Gradually increase the tension, without tensing the muscles along the top of your shoulders and the back of your neck.  Hold for __________ seconds. Slowly release the tension and relax your muscles completely before starting the next repetition.  After you have practiced this exercise, remove the arm support and complete the exercise in standing as well as sitting position. Repeat __________ times. Complete this exercise __________ times per day.  STRENGTH - Shoulder Abductors, Isometric  With good posture, stand or sit about 4-6 inches from a wall, with your right / left side facing the wall.  Bend your right / left elbow. Gently press your right / left elbow into the wall. Increase the pressure gradually, until you are pressing as hard as you can, without shrugging your shoulder or increasing any shoulder discomfort.  Hold for __________ seconds.  Release the tension slowly. Relax your shoulder muscles completely before you begin the next repetition. Repeat __________ times. Complete this exercise __________ times per day.  STRENGTH - External Rotators, Isometric  Keep your right / left elbow at your side and bend it 90 degrees.  Step into a door frame so that the outside of your right / left wrist can press against the door frame without your upper arm leaving your side.  Gently press your right / left wrist into the door frame, as if you were trying to swing the back of your hand away from your stomach. Gradually increase the tension, until you are pressing as hard as you can, without shrugging your shoulder or increasing any shoulder discomfort.  Hold for __________ seconds.  Release  the tension slowly. Relax your shoulder muscles completely before you begin the next repetition. Repeat __________ times. Complete this exercise __________ times per day.  STRENGTH - Supraspinatus   Stand or sit with good posture. Grasp a __________ weight, or an exercise band or tubing, so that your hand is "thumbs-up," like you are shaking hands.  Slowly lift your right / left arm in a "V" away from your thigh, diagonally into the space between your side and straight ahead. Lift your hand to shoulder height or as far as you can, without increasing any shoulder pain. At first, many people do not lift their hands above shoulder height.  Avoid shrugging your right / left shoulder as your arm rises, by keeping your shoulder blade tucked down and  toward your mid-back spine.  Hold for __________ seconds. Control the descent of your hand, as you slowly return to your starting position. Repeat __________ times. Complete this exercise __________ times per day.  STRENGTH - External Rotators  Secure a rubber exercise band or tubing to a fixed object (table, pole) so that it is at the same height as your right / left elbow when you are standing or sitting on a firm surface.  Stand or sit so that the secured exercise band is at your uninjured side.  Bend your right / left elbow 90 degrees. Place a folded towel or small pillow under your right / left arm, so that your elbow is a few inches away from your side.  Keeping the tension on the exercise band, pull it away from your body, as if pivoting on your elbow. Be sure to keep your body steady, so that the movement is coming only from your rotating shoulder.  Hold for __________ seconds. Release the tension in a controlled manner, as you return to the starting position. Repeat __________ times. Complete this exercise __________ times per day.  STRENGTH - Internal Rotators   Secure a rubber exercise band or tubing to a fixed object (table, pole) so that  it is at the same height as your right / left elbow when you are standing or sitting on a firm surface.  Stand or sit so that the secured exercise band is at your right / left side.  Bend your elbow 90 degrees. Place a folded towel or small pillow under your right / left arm so that your elbow is a few inches away from your side.  Keeping the tension on the exercise band, pull it across your body, toward your stomach. Be sure to keep your body steady, so that the movement is coming only from your rotating shoulder.  Hold for __________ seconds. Release the tension in a controlled manner, as you return to the starting position. Repeat __________ times. Complete this exercise __________ times per day.  STRENGTH - Scapular Protractors, Standing   Stand arms length away from a wall. Place your hands on the wall, keeping your elbows straight.  Begin by dropping your shoulder blades down and toward your mid-back spine.  To strengthen your protractors, keep your shoulder blades down, but slide them forward on your rib cage. It will feel as if you are lifting the back of your rib cage away from the wall. This is a subtle motion and can be challenging to complete. Ask your caregiver for further instruction, if you are not sure you are doing the exercise correctly.  Hold for _______10___ seconds. Slowly return to the starting position, resting the muscles completely before starting the next repetition. Repeat ______10____ times. Complete this exercise __________ times per day. STRENGTH - Scapular Protractors, Supine  Lie on your back on a firm surface. Extend your right / left arm straight into the air while holding a __________ weight in your hand.  Keeping your head and back in place, lift your shoulder off the floor.  Hold for __________ seconds. Slowly return to the starting position, and allow your muscles to relax completely before starting the next repetition. Repeat __________ times.  Complete this exercise __________ times per day. STRENGTH - Scapular Protractors, Quadruped  Get onto your hands and knees, with your shoulders directly over your hands (or as close as you can be, comfortably).  Keeping your elbows locked, lift the back of your rib cage up  into your shoulder blades, so your mid-back rounds out. Keep your neck muscles relaxed.  Hold this position for __________ seconds. Slowly return to the starting position and allow your muscles to relax completely before starting the next repetition. Repeat __________ times. Complete this exercise __________ times per day.  STRENGTH - Scapular Retractors  Secure a rubber exercise band or tubing to a fixed object (table, pole), so that it is at the height of your shoulders when you are either standing, or sitting on a firm armless chair.  With a palm down grip, grasp an end of the band in each hand. Straighten your elbows and lift your hands straight in front of you, at shoulder height. Step back, away from the secured end of the band, until it becomes tense.  Squeezing your shoulder blades together, draw your elbows back toward your sides, as you bend them. Keep your upper arms lifted away from your body throughout the exercise.  Hold for ______10____ seconds. Slowly ease the tension on the band, as you reverse the directions and return to the starting position. Repeat __10________ times. Complete this exercise __1________ times per day. STRENGTH - Shoulder Extensors   Secure a rubber exercise band or tubing to a fixed object (table, pole) so that it is at the height of your shoulders when you are either standing, or sitting on a firm armless chair.  With a thumbs-up grip, grasp an end of the band in each hand. Straighten your elbows and lift your hands straight in front of you, at shoulder height. Step back, away from the secured end of the band, until it becomes tense.  Squeezing your shoulder blades together, pull your  hands down to the sides of your thighs. Do not allow your hands to go behind you.  Hold for __10________ seconds. Slowly ease the tension on the band, as you reverse the directions and return to the starting position. Repeat __10________ times. Complete this exercise ___1_______ times per day.  STRENGTH - Scapular Retractors and External Rotators   Secure a rubber exercise band or tubing to a fixed object (table, pole) so that it is at the height as your shoulders, when you are either standing, or sitting on a firm armless chair.  With a palm down grip, grasp an end of the band in each hand. Bend your elbows 90 degrees and lift your elbows to shoulder height, at your sides. Step back, away from the secured end of the band, until it becomes tense.  Squeezing your shoulder blades together, rotate your shoulders so that your upper arms and elbows remain stationary, but your fists travel upward to head height.  Hold for __________ seconds. Slowly ease the tension on the band, as you reverse the directions and return to the starting position. Repeat __________ times. Complete this exercise __________ times per day.  STRENGTH - Scapular Retractors and External Rotators, Rowing   Secure a rubber exercise band or tubing to a fixed object (table, pole) so that it is at the height of your shoulders, when you are either standing, or sitting on a firm armless chair.  With a palm down grip, grasp an end of the band in each hand. Straighten your elbows and lift your hands straight in front of you, at shoulder height. Step back, away from the secured end of the band, until it becomes tense.  Step 1: Squeeze your shoulder blades together. Bending your elbows, draw your hands to your chest, as if you are rowing a  boat. At the end of this motion, your hands and elbow should be at shoulder height and your elbows should be out to your sides.  Step 2: Rotate your shoulders, to raise your hands above your head. Your  forearms should be vertical and your upper arms should be horizontal.  Hold for __________ seconds. Slowly ease the tension on the band, as you reverse the directions and return to the starting position. Repeat __________ times. Complete this exercise __________ times per day.  STRENGTH - Scapular Depressors  Find a sturdy chair without wheels, such as a dining room chair.  Keeping your feet on the floor, and your hands on the chair arms, lift your bottom up from the seat, and lock your elbows.  Keeping your elbows straight, allow gravity to pull your body weight down. Your shoulders will rise toward your ears.  Raise your body against gravity by drawing your shoulder blades down your back, shortening the distance between your shoulders and ears. Although your feet should always maintain contact with the floor, your feet should progressively support less body weight, as you get stronger.  Hold for ____10______ seconds. In a controlled and slow manner, lower your body weight to begin the next repetition. Repeat ______10____ times. Complete this exercise _____2_____ times per day.  Document Released: 12/24/2004 Document Revised: 03/18/2011 Document Reviewed: 04/07/2008 [facility] Patient Information 2015 [place], [place]. This information is not intended to replace advice given to you by your health care provider. Make sure you discuss any questions you have with your health care provider.

## 2014-09-23 NOTE — Progress Notes (Signed)
Subjective:    Patient ID: George Stevens, male    DOB: 1940/06/29, 74 y.o.   MRN: [id number] 74 y.o. Right handed male with history of HTN, DM type 2, who was admitted on 08/09/2014 with right-sided weakness, facial droop and slurred speech.  MRI of the head showed acute nonhemorrhagic perforator infarct on the left favoring anterior chorodial over lateral lenticulostriate distribution and bifrontal gliosis c/w remote cerebral contusions. MRA was negative for stenosis or occlusion. Carotid Dopplers without ICA stenosis. 2D echo with ejection fraction of 65% no wall motion abnormalities with grade 1 diastolic dysfunction.  Patient did receive TPA and was started on Unasyn due to cellulitis from recent cat bite. . Dr. 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  Inpatient rehabilitation Admit date: 08/12/2014 Discharge date: 08/25/2014  HPI PCP [facility] Dr Katrinka Blazing  Chief complaint right shoulder pain  Patient at home  Living with wife. He is independent with dressing and bathing. He went to outpatient physical therapy and attended 3 sessions. The patient no longer wanted to participate according to PT notes. The patient stated that he still does his home exercise program. He has had no falls at home. He has followed up with his primary care physician as well as endocrinologist. He complains of right shoulder pain. No trauma to that area. No prior history of shoulder pain. Pain is mainly when he elevates his arm he notices this during dressing. If he sleeps on his right shoulder this also causes pain. Past history is significant for gout however it has never affected shoulder. Pain Inventory Average Pain 2 Pain Right Now 1 My pain is constant and shoulder (stroke side)  In the last 24 hours, has pain interfered with the following? General activity 0 Relation with others 0 Enjoyment of life 0 What TIME of day is  your pain at its worst? varies Sleep (in general) Good  Pain is worse with: unsure Pain improves with: rest Relief from Meds: no answer  Mobility walk without assistance how many minutes can you walk? 45 ability to climb steps?  yes  Function not employed: date last employed .  Neuro/Psych No problems in this area  Prior Studies Any changes since last visit?  no  Physicians involved in your care Any changes since last visit?  no   Family History  Problem Relation Age of Onset  . Hypertension Mother   . Hypertension Father   . Leukemia Brother   . Diabetes Father   . Emphysema Father    Social History   Social History  . Marital Status: Married    Spouse Name: N/A  . Number of Children: N/A  . Years of Education: N/A   Social History Main Topics  . Smoking status: Current Some Day Smoker    Types: Pipe  . Smokeless tobacco: Never Used  . Alcohol Use: Yes     Comment: "very rarely"  . Drug Use: No  . Sexual Activity: Not Asked   Other Topics Concern  . None   Social History Narrative   Past Surgical History  Procedure Laterality Date  . Cataract extraction    . Colonoscopy w/ polypectomy     Past Medical History  Diagnosis Date  . Hypertension   . Diabetes mellitus without complication   . History of colon polyps   . Hesitancy of micturition   . Concussion with brief LOC  Syncope X 1 in 1996. Another episode with fall and loss of sense of taste as well as brief memory deficits.    There were no vitals taken for this visit.  Opioid Risk Score:   Fall Risk Score:  `1  Depression screen PHQ 2/9  Depression screen [facility] 2/9 09/23/2014 09/23/2014  Decreased Interest 0 0  Down, Depressed, Hopeless 0 0  PHQ - 2 Score 0 0  Altered sleeping 1 -  Tired, decreased energy 2 -  Change in appetite 1 -  Feeling bad or failure about yourself  0 -  Trouble concentrating 0 -  Moving slowly or fidgety/restless 0 -  Suicidal thoughts 0 -  PHQ-9 Score 4  -     Review of Systems  Musculoskeletal:       Stroke affected side shoulder discomfort  All other systems reviewed and are negative.      Objective:   Physical Exam  Constitutional: He is oriented to person, place, and time. He appears well-developed and well-nourished.  HENT:  Head: Normocephalic and atraumatic.  Eyes: Conjunctivae and EOM are normal. Pupils are equal, round, and reactive to light.  Neck: Normal range of motion.  Cardiovascular: Normal rate, regular rhythm, normal heart sounds and intact distal pulses.   No murmur heard. Pulmonary/Chest: Effort normal and breath sounds normal. No respiratory distress.  Abdominal: Soft. Bowel sounds are normal. He exhibits no distension. There is tenderness.  Musculoskeletal:       Right shoulder: He exhibits decreased range of motion and pain. He exhibits no tenderness.  Positive impingement sign, Hawkins maneuver right shoulder at 90  Neurological: He is alert and oriented to person, place, and time. He has normal strength. No sensory deficit. Coordination and gait abnormal.  4/5 Right deltoid , bi, tri, grip, HF, KE, ADF 5 minus right hip flexor knee extensor 4 minus right ankle dorsiflexor  Psychiatric: He has a normal mood and affect.  Nursing note and vitals reviewed.   Visual fields are intact confrontation testing Extraocular muscles are intact      Assessment & Plan:  1. Left subcortical infarct with right hemiparesis. He has made good improvements with strength but does have mild residual weakness in the right upper extremity as well as in the right ankle dorsiflexor.  We discussed prognosis. We discussed return to driving with patient and his wife.  Follow-up in 8 weeks  Graduated return to driving instructions were provided. It is recommended that the patient first drives with another licensed driver in an empty parking lot. If the patient does well with this, and they can drive on a quiet street with the  licensed driver. If the patient does well with this they can drive on a busy street with a licensed driver. If the patient does well with this, the next time out they can go by himself. For the first month after resuming driving, I recommend no nighttime or Interstate driving.   2. 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 Do not recommend walking long distances on grass. Level surfaces would be best. Do not think he needs an assistive device unless he is on uneven surfaces. He is to wear his foot up orthosis for longer distances. Around the house he can go without it  3. 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 This is due to abnormal biomechanics of the shoulder girdle musculature. Advised not to take nonsteroidal anti-inflammatories or aspirin due to  recent stroke as well as being on Plavix. He may use Tylenol. I gave him exercises to do at home. Instructions. If this is not helpful I would recommend corticosteroid injection.  4. Diabetes mellitus follow-up with primary care physician he was switched from metformin to glipizide in the hospital because of elevated creatinine he is now back on metformin as per his endocrinologist recommendation.  5. Hypertension follow up with primary care physician

## 2014-10-12 ENCOUNTER — Encounter: Payer: Self-pay | Admitting: Neurology

## 2014-10-12 ENCOUNTER — Ambulatory Visit (INDEPENDENT_AMBULATORY_CARE_PROVIDER_SITE_OTHER): Payer: Medicare HMO | Admitting: Neurology

## 2014-10-12 VITALS — BP 190/78 | HR 72 | Ht 65.0 in | Wt 194.0 lb

## 2014-10-12 DIAGNOSIS — I63312 Cerebral infarction due to thrombosis of left middle cerebral artery: Secondary | ICD-10-CM | POA: Insufficient documentation

## 2014-10-12 DIAGNOSIS — N183 Chronic kidney disease, stage 3 unspecified: Secondary | ICD-10-CM | POA: Insufficient documentation

## 2014-10-12 DIAGNOSIS — E1159 Type 2 diabetes mellitus with other circulatory complications: Secondary | ICD-10-CM

## 2014-10-12 DIAGNOSIS — E785 Hyperlipidemia, unspecified: Secondary | ICD-10-CM

## 2014-10-12 DIAGNOSIS — I10 Essential (primary) hypertension: Secondary | ICD-10-CM | POA: Diagnosis not present

## 2014-10-12 MED ORDER — AMLODIPINE BESYLATE 10 MG PO TABS: 10.0000 mg | ORAL_TABLET | Freq: Every day | ORAL | Status: AC

## 2014-10-12 NOTE — Patient Instructions (Signed)
-   continue plavix and lipitor for stroke prevention - will need to add amlodipine for better BP control - check BP at home at least twice a day and record and bring over to PCP for BP management.  - check glucose a thome - Follow up with your primary care physician for stroke risk factor modification. Recommend maintain blood pressure goal <130/80, diabetes with hemoglobin A1c goal below 6.5% and lipids with LDL cholesterol goal below 70 mg/dL.  - follow up in 3 months.

## 2014-10-12 NOTE — Progress Notes (Signed)
STROKE NEUROLOGY FOLLOW UP NOTE  NAME: George Stevens DOB: 02-15-1940  REASON FOR VISIT: stroke follow up HISTORY FROM: pt and wife and chart  Today we had the pleasure of seeing George Stevens in follow-up at our Neurology Clinic. Pt was accompanied by wife.   History Summary [name] is a 74 y.o. male with history of HTN, DM, HLD, CKD admitted on 08/09/14 for right sided weakness and subsequently treated with intravenous TPA. MRI showed left BG and CR infarct most consistent with small vessel disease. MRA, CUS and TTE unremarkable but LDL 91 and A1C 10.7. His ASA changed to plavix and lipitor increased to 80. He was discharged to CIR.   Interval History During the interval time, the patient has been doing well. Completed with PT/OT and muscle strength much better. Still complain of gait unsteady sometimes. However, his BP still high, 182/77 today in clinic. Pt not checking BP at home. He is on coreg, losartan and terazosin. His glucose today 137. Denies sign or symptoms of OSA. Refused sleep study this time.    REVIEW OF SYSTEMS: Full 14 system review of systems performed and notable only for those listed below and in HPI above, all others are negative:  Constitutional:  fatigue Cardiovascular:  Ear/Nose/Throat:   Skin: Moles Eyes:   Respiratory:   Gastroitestinal:  Constipation, diarrhea Genitourinary: Frequency of urination, urgency Hematology/Lymphatic:   Endocrine:  Musculoskeletal:  Walking difficulty Allergy/Immunology:   Neurological:   Psychiatric:  Sleep: Restless leg, snoring  The following represents the patient's updated allergies and side effects list: No Known Allergies  The neurologically relevant items on the patient's problem list were reviewed on today's visit.  Neurologic Examination  A problem focused neurological exam (12 or more points of the single system neurologic examination, vital signs counts as 1 point, cranial nerves count for  8 points) was performed.  Blood pressure 190/78, pulse 72, height 5\' 5"  (1.651 m), weight 194 lb (87.998 kg).  General - Well nourished, well developed, in no apparent distress.  Ophthalmologic - Sharp disc margins OU.  Cardiovascular - Regular rate and rhythm with no murmur.  Neck - supple, no carotid bruits  Mental Status -  Level of arousal and orientation to time, place, and person were intact. Language including expression, naming, repetition, comprehension was assessed and found intact. Fund of Knowledge was assessed and was intact.  Cranial Nerves II - XII - II - Visual field intact OU. III, IV, VI - Extraocular movements intact. V - Facial sensation intact bilaterally. VII - Facial movement intact bilaterally. VIII - Hearing & vestibular intact bilaterally. X - Palate elevates symmetrically. XI - Chin turning & shoulder shrug intact bilaterally. XII - Tongue protrusion intact.  Motor Strength - The patient's strength was normal in all extremities except mild right hand dexterity difficulty. Bulk was normal and fasciculations were absent.  Motor Tone - Muscle tone was assessed at the neck and appendages and was normal.  Reflexes - The patient's reflexes were symmetrical in all extremities and he had no pathological reflexes.  Sensory - Light touch, temperature/pinprick were assessed and were symmetrical.   Coordination - The patient had normal movements in the hands and feet with no ataxia or dysmetria. Tremor was absent.  Gait and Station - walk without device, mild right hemiparetic gait.  Data reviewed: I personally reviewed the images and agree with the radiology interpretations.  Ct Head Wo Contrast 08/09/2014  IMPRESSION:  No acute intracranial abnormality   Mri  and Mra Brain Wo Contrast 08/10/2014  IMPRESSION:  1. 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  2.  MRA negative for treatable stenosis or occlusion.  3. 2 mm left carotid terminus outpouching which could reflect infundibulum or aneurysm.  4. Bifrontal gliosis, pattern consistent with remote cerebral contusions.   Carotid Doppler Bilateral: 1-39% ICA stenosis. Vertebral artery flow is antegrade.  2D Echocardiogram  08/10/2014 Study Conclusions - Left ventricle: The cavity size was normal. There was moderate concentric hypertrophy. Systolic function was normal. The estimated ejection fraction was in the range of 60% to 65%. Wall motion was normal; there were no regional wall motion abnormalities. Doppler parameters are consistent with abnormal left ventricular relaxation (grade 1 diastolic dysfunction). There was no evidence of elevated ventricular filling pressure by Doppler parameters. - Aortic valve: Trileaflet; moderately thickened, mildly calcified leaflets. Valve mobility was restricted. There was mild stenosis. Mean gradient (S): 11 mm Hg. Peak gradient (S): 18 mm Hg. Valve area (VTI): 1.88 cm^2. Valve area (Vmax): 1.49 cm^2. Valve area (Vmean): 1.19 cm^2. - Aortic root: The aortic root was normal in size. - Mitral valve: Calcified annulus. Mildly thickened leaflets . - Left atrium: The atrium was normal in size. - Right ventricle: The cavity size was normal. Wall thickness was normal. Systolic function was normal. - Right atrium: The atrium was normal in size. - Tricuspid valve: Structurally normal valve. There was mild regurgitation. - Pulmonic valve: There was no regurgitation. - Pulmonary arteries: Systolic pressure was within the normal range. - Inferior vena cava: The vessel was normal in size. - Pericardium, extracardiac: There was no pericardial effusion.   Component     Latest Ref Rng 08/10/2014  Cholesterol     0 - 200 mg/dL 098  Triglycerides     <150 mg/dL 119 (H)  HDL Cholesterol     >40 mg/dL 34 (L)  Total CHOL/HDL  Ratio      4.7  VLDL     0 - 40 mg/dL 35  LDL (calc)     0 - 99 mg/dL 91  Hemoglobin J4N     4.8 - 5.6 % 10.7 (H)  Mean Plasma Glucose      260    Assessment: As you may recall, he is a 74 y.o. Caucasian male with PMH of HTN, HLD, DM, CKD was admitted on 08/09/2014 for left basal ganglia and corona radiata infarcts, consistent with small vessel disease. MRA, carotid Doppler, 2-D echo unremarkable. A1c and 0.7 and LDL 91. Aspirin changed to Plavix and increase Lipitor to 80. During interval time, patient symptoms mostly resolved, however BP 9 good control still at 180s in clinic today. He is on Coreg, losartan, Terazosin, will add amlodipine. Patient refused sleep study.  Plan:  - continue plavix and lipitor for stroke prevention - will need to add amlodipine for better BP control - check BP at home at least twice a day and record and bring over to PCP for BP management.  - check glucose a thome - Follow up with your primary care physician for stroke risk factor modification. Recommend maintain blood pressure goal <130/80, diabetes with hemoglobin A1c goal below 6.5% and lipids with LDL cholesterol goal below 70 mg/dL.  - RTC in 3 months.  No orders of the defined types were placed in this encounter.    Meds ordered this encounter  Medications  . metFORMIN (GLUCOPHAGE) 1000 MG tablet    Sig: Take 1,000 mg by  mouth daily with breakfast.  . insulin aspart (NOVOLOG FLEXPEN) 100 UNIT/ML FlexPen    Sig: Inject 15 Units into the skin 3 (three) times daily with meals.  Marland Kitchen amLODipine (NORVASC) 10 MG tablet    Sig: Take 1 tablet (10 mg total) by mouth daily.    Dispense:  30 tablet    Refill:  3    Patient Instructions  - continue plavix and lipitor for stroke prevention - will need to add amlodipine for better BP control - check BP at home at least twice a day and record and bring over to PCP for BP management.  - check glucose a thome - Follow up with your primary care physician for  stroke risk factor modification. Recommend maintain blood pressure goal <130/80, diabetes with hemoglobin A1c goal below 6.5% and lipids with LDL cholesterol goal below 70 mg/dL.  - follow up in 3 months.   Marvel Plan, MD PhD [facility] [address], [address] [place], [place] [address] [phone]

## 2014-11-22 ENCOUNTER — Ambulatory Visit: Payer: Medicare HMO | Admitting: Physical Medicine & Rehabilitation

## 2015-01-17 ENCOUNTER — Ambulatory Visit: Payer: Medicare HMO | Admitting: Neurology

## 2015-01-18 ENCOUNTER — Encounter: Payer: Self-pay | Admitting: Neurology

## 2017-04-28 IMAGING — CT CT HEAD W/O CM
2 series · 16 of 30 positions shown, 20 images · non-contrast
Comparison: None.

CLINICAL DATA: Right-sided weakness

EXAM:
CT HEAD WITHOUT CONTRAST
TECHNIQUE: Contiguous axial images were obtained from the base of the skull
through the vertex without intravenous contrast.

[Series 201: head w/o, idose (1) · axial · non-contrast · 0.40mm/px · z∈[+111,+231]mm · 13 of 30 slices shown, 17 images]
[im 3/30  brain]
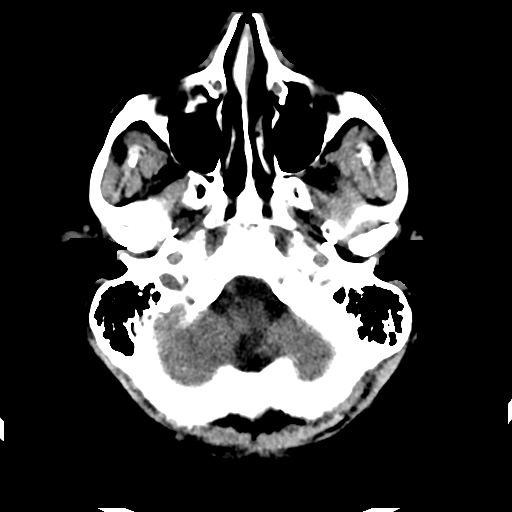
[im 3/30  bone]
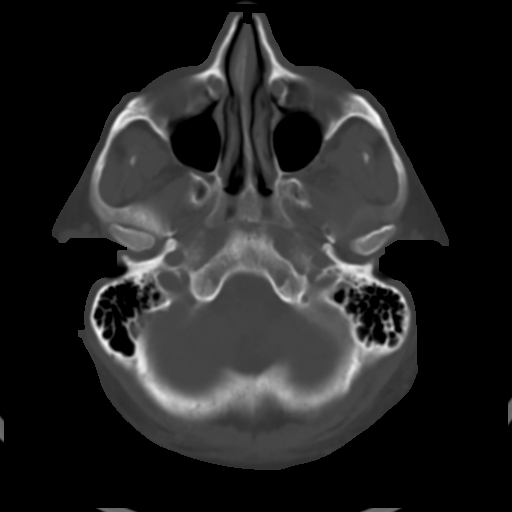
[im 5/30  brain]
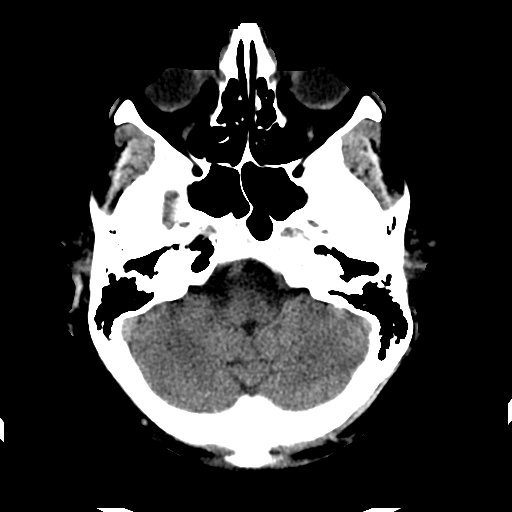
[im 7/30  brain]
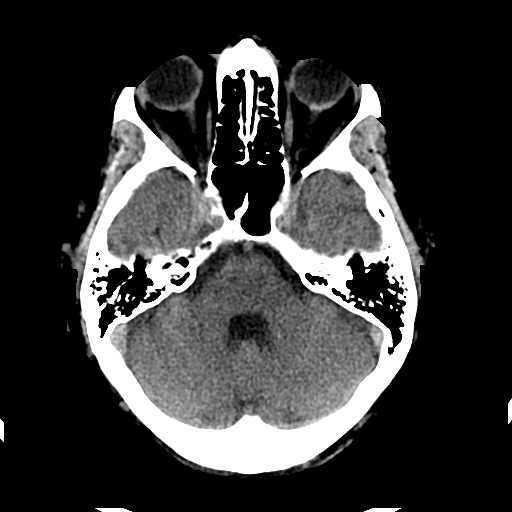
[im 9/30  brain]
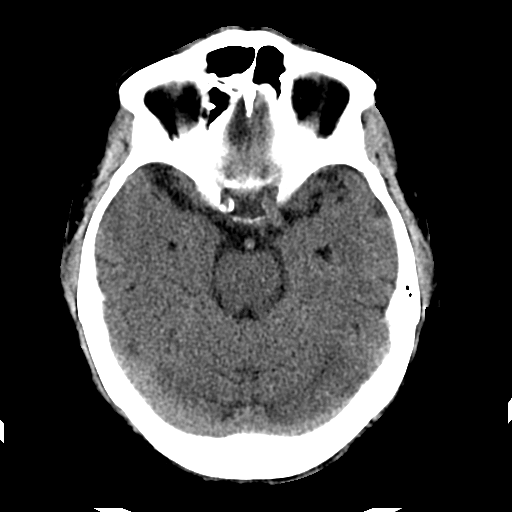
[im 11/30  brain]
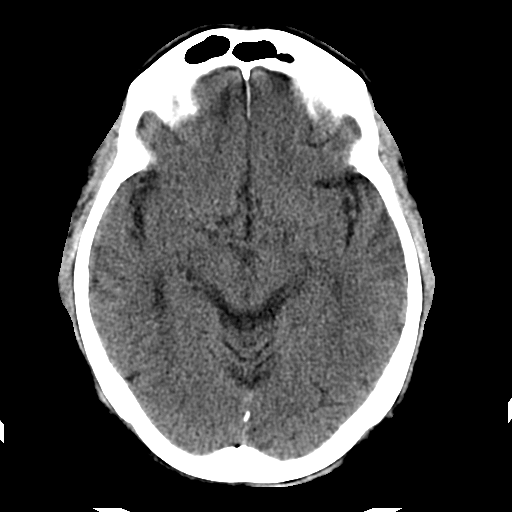
[im 11/30  bone]
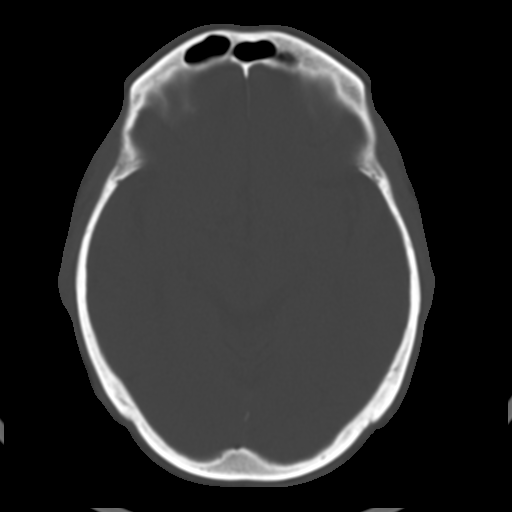
[im 13/30  brain]
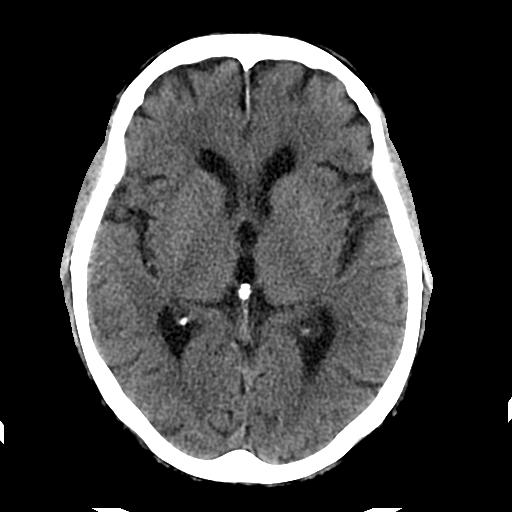
[im 15/30  brain]
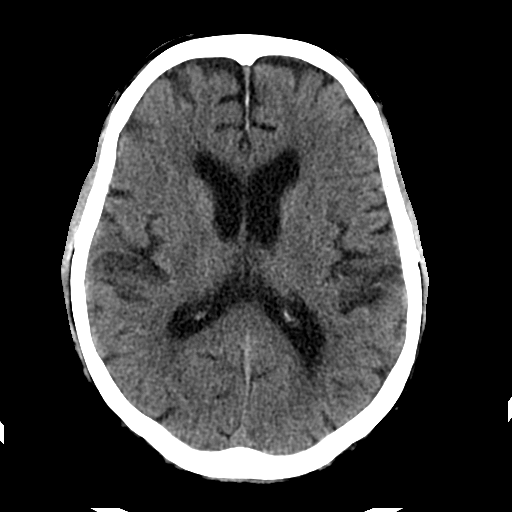
[im 17/30  brain]
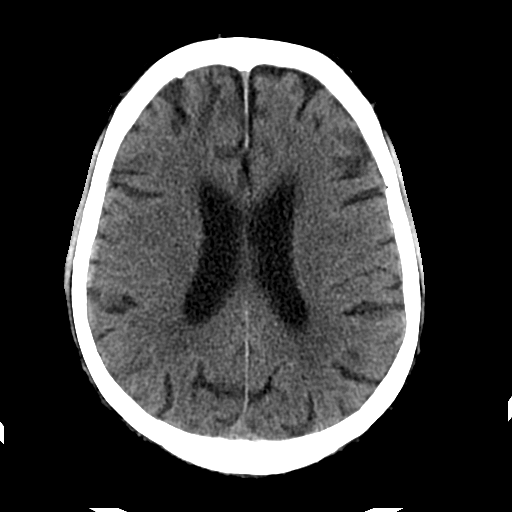
[im 19/30  brain]
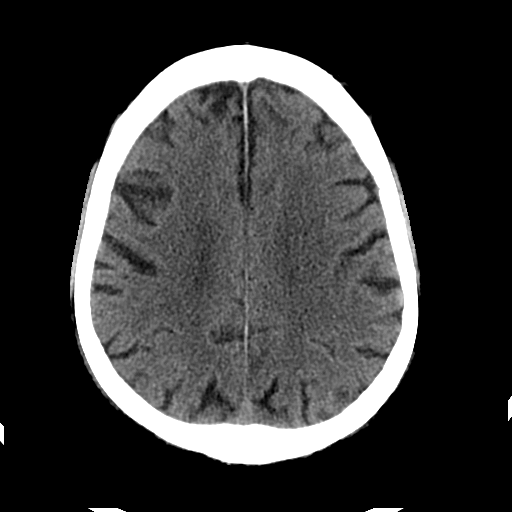
[im 19/30  bone]
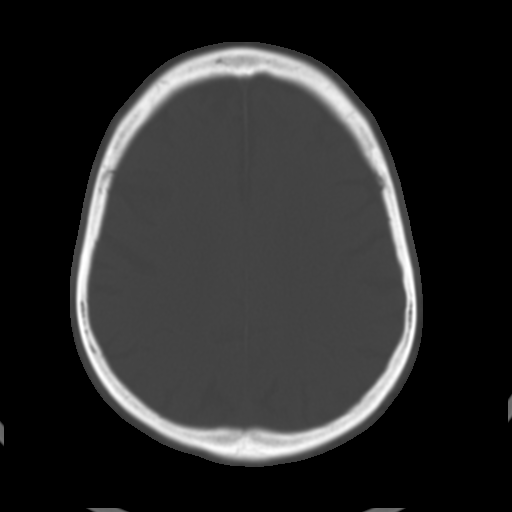
[im 21/30  brain]
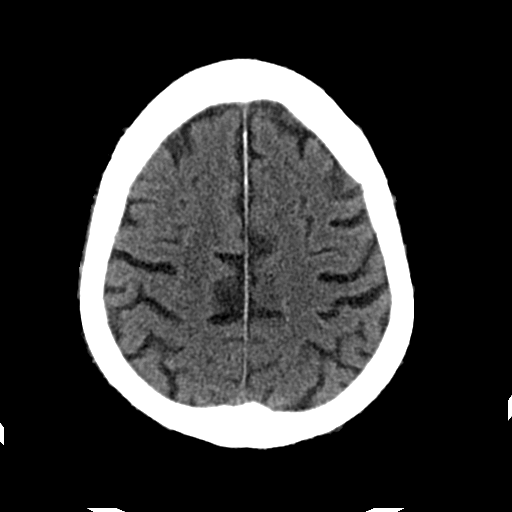
[im 23/30  brain]
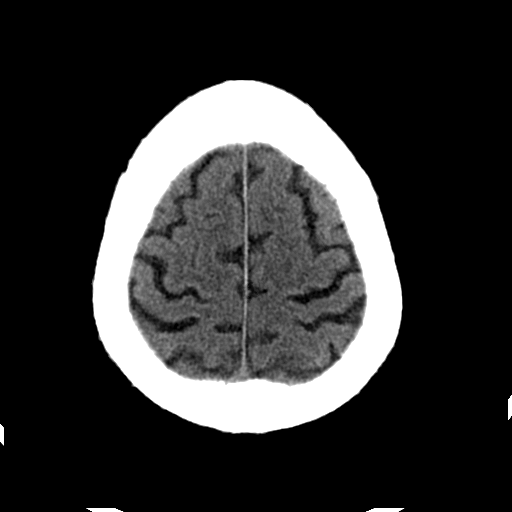
[im 25/30  brain]
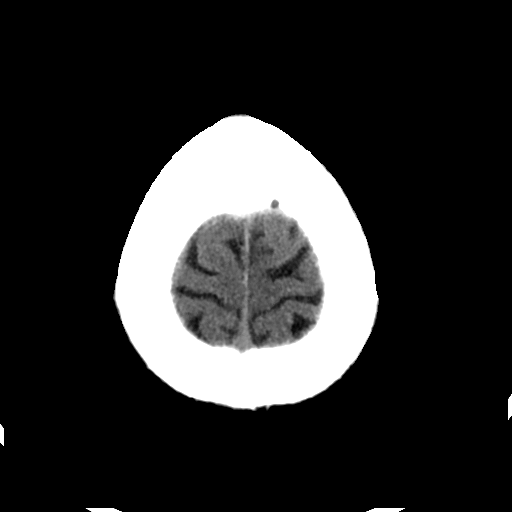
[im 27/30  brain]
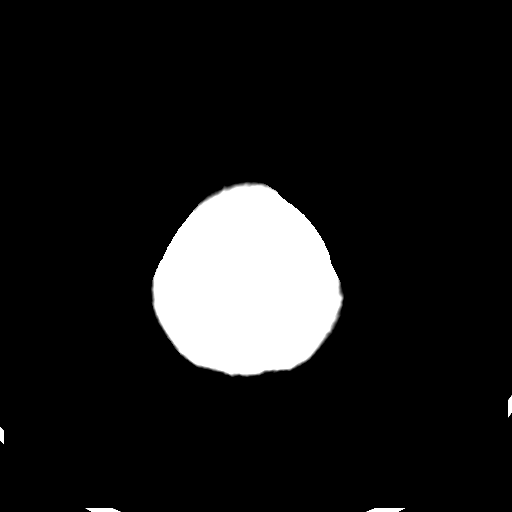
[im 27/30  bone]
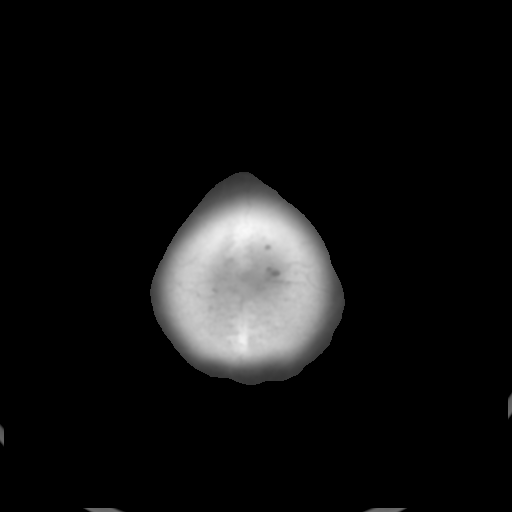

[Series 202: head w/o bone, idose (1) · axial · non-contrast · 0.40mm/px · z∈[+111,+151]mm · 3 of 30 slices shown]
[im 3/30  bone]
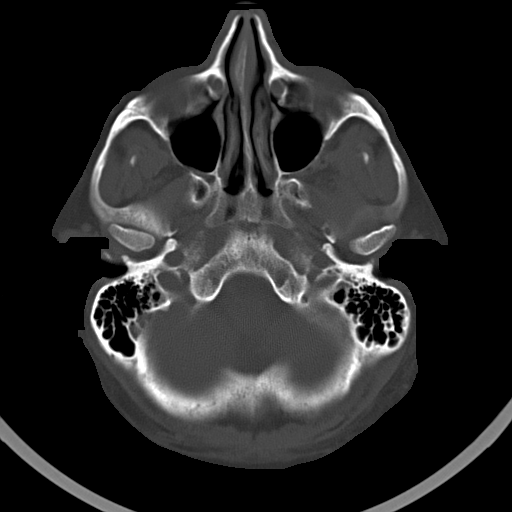
[im 7/30  bone]
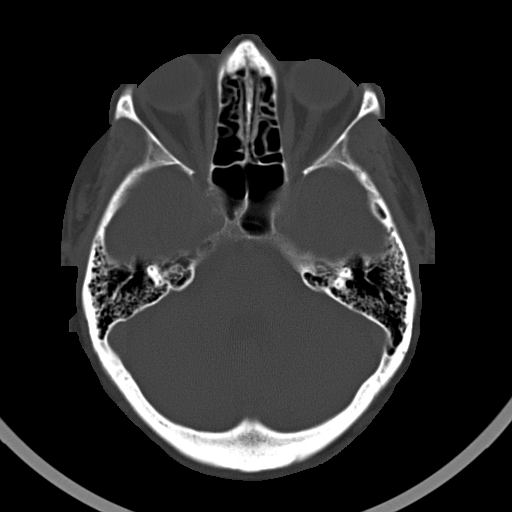
[im 11/30  bone]
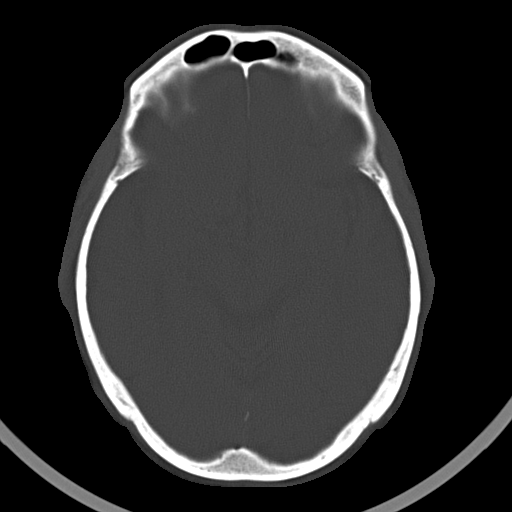

[16 of 30 positions shown; findings below may reference images not displayed]

FINDINGS: Generalized atrophy.  Mild chronic microvascular white matter .

Negative for acute infarct.  Negative for hemorrhage or mass.

Ventricle size normal. No brain edema or shift of the midline
structures.

Calvarium intact.
IMPRESSION: No acute intracranial abnormality

Critical Value/emergent results were called by telephone at the time
of interpretation on 08/09/2014 at [DATE] to Sodki PA , who verbally
acknowledged these results.

## 2021-11-07 DEATH — deceased
